# Patient Record
Sex: Female | Born: 1995 | Race: White | Hispanic: No | Marital: Single | Smoking: Current every day smoker
Health system: Southern US, Community
[De-identification: ages and names within clinical notes are randomized; demographics above are authoritative.]

## PROBLEM LIST (undated history)

## (undated) DIAGNOSIS — N390 Urinary tract infection, site not specified: Secondary | ICD-10-CM

## (undated) DIAGNOSIS — G8929 Other chronic pain: Secondary | ICD-10-CM

## (undated) DIAGNOSIS — F909 Attention-deficit hyperactivity disorder, unspecified type: Secondary | ICD-10-CM

## (undated) DIAGNOSIS — F419 Anxiety disorder, unspecified: Secondary | ICD-10-CM

## (undated) DIAGNOSIS — F32A Depression, unspecified: Secondary | ICD-10-CM

## (undated) DIAGNOSIS — R51 Headache: Secondary | ICD-10-CM

## (undated) HISTORY — DX: Urinary tract infection, site not specified: N39.0

## (undated) HISTORY — DX: Headache: R51

## (undated) HISTORY — DX: Other chronic pain: G89.29

## (undated) HISTORY — DX: Attention-deficit hyperactivity disorder, unspecified type: F90.9

## (undated) HISTORY — PX: NO PAST SURGERIES: SHX2092

---
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
## 2013-01-11 ENCOUNTER — Ambulatory Visit: Payer: Medicaid Other

## 2013-01-12 ENCOUNTER — Encounter (HOSPITAL_COMMUNITY): Payer: Self-pay | Admitting: *Deleted

## 2013-01-12 ENCOUNTER — Emergency Department (HOSPITAL_COMMUNITY)
Admission: EM | Admit: 2013-01-12 | Discharge: 2013-01-12 | Disposition: A | Discharge status: Home / Self Care | Payer: Medicaid Other | Attending: Emergency Medicine | Admitting: Emergency Medicine

## 2013-01-12 DIAGNOSIS — K047 Periapical abscess without sinus: Secondary | ICD-10-CM | POA: Insufficient documentation

## 2013-01-12 DIAGNOSIS — F17200 Nicotine dependence, unspecified, uncomplicated: Secondary | ICD-10-CM | POA: Insufficient documentation

## 2013-01-12 DIAGNOSIS — R220 Localized swelling, mass and lump, head: Secondary | ICD-10-CM | POA: Insufficient documentation

## 2013-01-12 DIAGNOSIS — Z79899 Other long term (current) drug therapy: Secondary | ICD-10-CM | POA: Insufficient documentation

## 2013-01-12 DIAGNOSIS — K029 Dental caries, unspecified: Secondary | ICD-10-CM | POA: Insufficient documentation

## 2013-01-12 DIAGNOSIS — R221 Localized swelling, mass and lump, neck: Secondary | ICD-10-CM | POA: Insufficient documentation

## 2013-01-12 DIAGNOSIS — R51 Headache: Secondary | ICD-10-CM | POA: Insufficient documentation

## 2013-01-12 MED ORDER — MORPHINE SULFATE 4 MG/ML IJ SOLN
4.0000 mg | Freq: Once | INTRAMUSCULAR | Status: AC
Start: 2013-01-12 — End: 2013-01-12
  Administered 2013-01-12: 4 mg via INTRAVENOUS
  Filled 2013-01-12: qty 1

## 2013-01-12 MED ORDER — SODIUM CHLORIDE 0.9 % IV SOLN
Freq: Once | INTRAVENOUS | Status: AC
  Administered 2013-01-12: 14:00:00 via INTRAVENOUS

## 2013-01-12 MED ORDER — CLINDAMYCIN HCL 300 MG PO CAPS: 300.0000 mg | ORAL_CAPSULE | Freq: Four times a day (QID) | ORAL | Status: DC

## 2013-01-12 MED ORDER — HYDROCODONE-ACETAMINOPHEN 5-325 MG PO TABS: ORAL_TABLET | ORAL | Status: DC

## 2013-01-12 MED ORDER — DEXTROSE 5 % IV SOLN: 900.0000 mg | Freq: Once | INTRAVENOUS | Status: DC

## 2013-01-12 MED ORDER — CLINDAMYCIN PHOSPHATE 900 MG/50ML IV SOLN
INTRAVENOUS | Status: AC
  Administered 2013-01-12: 900 mg via INTRAVENOUS
  Filled 2013-01-12: qty 50

## 2013-01-12 MED ORDER — CLINDAMYCIN PHOSPHATE 900 MG/50ML IV SOLN
900.0000 mg | Freq: Once | INTRAVENOUS | Status: AC
  Administered 2013-01-12: 900 mg via INTRAVENOUS

## 2013-01-12 NOTE — ED Notes (Signed)
Rt side of face swollen , red  Started penicillin on Monday for dental  Problem.  Says she is no better and advised by dentist to come here.
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
## 2013-01-12 NOTE — ED Notes (Signed)
Lt side of face swollen , dental pain.

## 2013-01-16 NOTE — ED Provider Notes (Signed)
Medical screening examination/treatment/procedure(s) were performed by non-physician practitioner and as supervising physician I was immediately available for consultation/collaboration.  Vashawn Ekstein B. Daronte Shostak, MD 01/16/13 1655 

## 2013-01-17 ENCOUNTER — Ambulatory Visit: Payer: Medicaid Other

## 2013-01-18 ENCOUNTER — Encounter: Payer: Self-pay | Admitting: Adult Health

## 2013-01-18 ENCOUNTER — Ambulatory Visit (INDEPENDENT_AMBULATORY_CARE_PROVIDER_SITE_OTHER): Payer: Medicaid Other | Admitting: Adult Health

## 2013-01-18 VITALS — BP 110/64 | Ht 60.0 in | Wt 124.0 lb

## 2013-01-18 DIAGNOSIS — Z3200 Encounter for pregnancy test, result unknown: Secondary | ICD-10-CM

## 2013-01-18 DIAGNOSIS — Z3049 Encounter for surveillance of other contraceptives: Secondary | ICD-10-CM

## 2013-01-18 DIAGNOSIS — Z309 Encounter for contraceptive management, unspecified: Secondary | ICD-10-CM

## 2013-01-18 DIAGNOSIS — Z3202 Encounter for pregnancy test, result negative: Secondary | ICD-10-CM

## 2013-01-18 MED ORDER — MEDROXYPROGESTERONE ACETATE 150 MG/ML IM SUSP
150.0000 mg | Freq: Once | INTRAMUSCULAR | Status: AC
  Administered 2013-01-18: 150 mg via INTRAMUSCULAR

## 2013-04-12 ENCOUNTER — Encounter: Payer: Self-pay | Admitting: Adult Health

## 2013-04-12 ENCOUNTER — Ambulatory Visit (INDEPENDENT_AMBULATORY_CARE_PROVIDER_SITE_OTHER): Payer: Medicaid Other | Admitting: Adult Health

## 2013-04-12 VITALS — BP 120/76 | Ht 60.0 in | Wt 127.0 lb

## 2013-04-12 DIAGNOSIS — Z309 Encounter for contraceptive management, unspecified: Secondary | ICD-10-CM

## 2013-04-12 DIAGNOSIS — Z3202 Encounter for pregnancy test, result negative: Secondary | ICD-10-CM

## 2013-04-12 DIAGNOSIS — Z3200 Encounter for pregnancy test, result unknown: Secondary | ICD-10-CM

## 2013-04-12 DIAGNOSIS — Z3049 Encounter for surveillance of other contraceptives: Secondary | ICD-10-CM

## 2013-04-12 LAB — POCT URINE PREGNANCY: Preg Test, Ur: NEGATIVE

## 2013-04-12 MED ORDER — MEDROXYPROGESTERONE ACETATE 150 MG/ML IM SUSP
150.0000 mg | Freq: Once | INTRAMUSCULAR | Status: AC
  Administered 2013-04-12: 150 mg via INTRAMUSCULAR

## 2013-06-27 ENCOUNTER — Other Ambulatory Visit: Payer: Self-pay | Admitting: Adult Health

## 2013-07-05 ENCOUNTER — Ambulatory Visit (INDEPENDENT_AMBULATORY_CARE_PROVIDER_SITE_OTHER): Payer: Medicaid Other | Admitting: Adult Health

## 2013-07-05 ENCOUNTER — Encounter: Payer: Self-pay | Admitting: Adult Health

## 2013-07-05 VITALS — BP 118/66 | Ht 60.0 in

## 2013-07-05 DIAGNOSIS — Z3200 Encounter for pregnancy test, result unknown: Secondary | ICD-10-CM

## 2013-07-05 DIAGNOSIS — Z309 Encounter for contraceptive management, unspecified: Secondary | ICD-10-CM

## 2013-07-05 DIAGNOSIS — Z3202 Encounter for pregnancy test, result negative: Secondary | ICD-10-CM

## 2013-07-05 DIAGNOSIS — Z3049 Encounter for surveillance of other contraceptives: Secondary | ICD-10-CM

## 2013-07-05 LAB — POCT URINE PREGNANCY: Preg Test, Ur: NEGATIVE

## 2013-07-05 MED ORDER — MEDROXYPROGESTERONE ACETATE 150 MG/ML IM SUSP
150.0000 mg | Freq: Once | INTRAMUSCULAR | Status: AC
  Administered 2013-07-05: 150 mg via INTRAMUSCULAR

## 2013-07-06 ENCOUNTER — Emergency Department (HOSPITAL_COMMUNITY)
Admission: EM | Admit: 2013-07-06 | Discharge: 2013-07-06 | Disposition: A | Discharge status: Home / Self Care | Payer: Medicaid Other | Attending: Emergency Medicine | Admitting: Emergency Medicine

## 2013-07-06 ENCOUNTER — Encounter (HOSPITAL_COMMUNITY): Payer: Self-pay | Admitting: Emergency Medicine

## 2013-07-06 DIAGNOSIS — J111 Influenza due to unidentified influenza virus with other respiratory manifestations: Secondary | ICD-10-CM | POA: Insufficient documentation

## 2013-07-06 DIAGNOSIS — F17200 Nicotine dependence, unspecified, uncomplicated: Secondary | ICD-10-CM | POA: Insufficient documentation

## 2013-07-06 DIAGNOSIS — R6889 Other general symptoms and signs: Secondary | ICD-10-CM

## 2013-07-06 MED ORDER — BENZONATATE 200 MG PO CAPS: 200.0000 mg | ORAL_CAPSULE | Freq: Three times a day (TID) | ORAL | Status: DC | PRN

## 2013-07-06 NOTE — Discharge Instructions (Signed)

## 2013-07-06 NOTE — ED Notes (Signed)
Pt reports being sick w/ flu like symptoms since Sunday.

## 2013-07-07 NOTE — ED Provider Notes (Signed)
CSN: [id number]     Arrival date & time 07/06/13  1416 History   First MD Initiated Contact with Patient 07/06/13 1620     Chief Complaint  Patient presents with  . Influenza   (Consider location/radiation/quality/duration/timing/severity/associated sxs/prior Treatment) HPI Comments: Carla Schaefer is a 18 y.o. Female with a 3 day history of uri type symptoms which includes nasal congestion with clear rhinorrhea, low grade fever and nonproductive cough.  Symptoms due to not include shortness of breath, chest pain,  Nausea, vomiting or diarrhea.  The patient has taken tylenol cold multi symptom formula prior to arrival with no significant improvement in symptoms.      The history is provided by the patient.    History reviewed. No pertinent past medical history. History reviewed. No pertinent past surgical history. Family History  Problem Relation Age of Onset  . Cancer Other   . Thyroid disease Other    History  Substance Use Topics  . Smoking status: Current Every Day Smoker -- 0.50 packs/day for 2 years    Types: Cigarettes  . Smokeless tobacco: Never Used  . Alcohol Use: No   OB History   Grav Para Term Preterm Abortions TAB SAB Ect Mult Living   1    1  1         Review of Systems  Constitutional: Positive for fever and chills.  HENT: Positive for congestion, rhinorrhea and sore throat. Negative for ear pain, sinus pressure, trouble swallowing and voice change.   Eyes: Negative for discharge.  Respiratory: Positive for cough. Negative for shortness of breath, wheezing and stridor.   Cardiovascular: Negative for chest pain.  Gastrointestinal: Negative for nausea, vomiting, abdominal pain and diarrhea.  Genitourinary: Negative.     Allergies  Compazine and Zofran  Home Medications   Current Outpatient Rx  Name  Route  Sig  Dispense  Refill  . medroxyPROGESTERone (DEPO-PROVERA) 150 MG/ML injection      TAKE TO MD OFFICE FOR INJECTION EVERY 3 MONTHS AS  INSTRUCTED   1 mL   3   . Phenyleph-Doxylamine-DM-APAP (TYLENOL COLD MULTI-SYMPTOM) 5-6.25-10-325 MG/15ML LIQD   Oral   Take 30 mLs by mouth 2 (two) times daily as needed.         . benzonatate (TESSALON) 200 MG capsule   Oral   Take 1 capsule (200 mg total) by mouth 3 (three) times daily as needed for cough.   30 capsule   0    BP 121/58  Pulse 96  Temp(Src) 98 F (36.7 C) (Oral)  Resp 20  Ht 5' (1.524 m)  Wt 127 lb (57.607 kg)  BMI 24.80 kg/m2  SpO2 100% Physical Exam  Constitutional: She is oriented to person, place, and time. She appears well-developed and well-nourished.  HENT:  Head: Normocephalic and atraumatic.  Right Ear: Tympanic membrane and ear canal normal.  Left Ear: Tympanic membrane and ear canal normal.  Nose: Mucosal edema and rhinorrhea present.  Mouth/Throat: Uvula is midline, oropharynx is clear and moist and mucous membranes are normal. No oropharyngeal exudate, posterior oropharyngeal edema, posterior oropharyngeal erythema or tonsillar abscesses.  Eyes: Conjunctivae are normal.  Cardiovascular: Normal rate and normal heart sounds.   Pulmonary/Chest: Effort normal and breath sounds normal. No respiratory distress. She has no decreased breath sounds. She has no wheezes. She has no rhonchi. She has no rales.  Frequent dry cough.  Lungs clear  Abdominal: Soft. There is no tenderness.  Musculoskeletal: Normal range of motion.  Neurological: She  is alert and oriented to person, place, and time.  Skin: Skin is warm and dry. No rash noted.  Psychiatric: She has a normal mood and affect.    ED Course  Procedures (including critical care time) Labs Review Labs Reviewed - No data to display Imaging Review No results found.  EKG Interpretation   None       MDM   1. Flu-like symptoms    URI.  Pt encouraged rest, increased fluids, motrin if needed for throat pain, may continue with tylenol cold medicine,  Prescribed tessalon for cough relief.     The patient appears reasonably screened and/or stabilized for discharge and I doubt any other medical condition or other [facility] requiring further screening, evaluation, or treatment in the ED at this time prior to discharge.     Burgess Amor, PA-C 07/07/13 1425

## 2013-07-08 NOTE — ED Provider Notes (Signed)
Medical screening examination/treatment/procedure(s) were performed by non-physician practitioner and as supervising physician I was immediately available for consultation/collaboration.  EKG Interpretation   None       Deneise Getty, MD, FACEP   Pressley Tadesse L Byron Peacock, MD 07/08/13 0811 

## 2013-09-27 ENCOUNTER — Ambulatory Visit: Payer: Self-pay

## 2013-09-28 ENCOUNTER — Encounter: Payer: Self-pay | Admitting: Obstetrics & Gynecology

## 2013-09-28 ENCOUNTER — Ambulatory Visit (INDEPENDENT_AMBULATORY_CARE_PROVIDER_SITE_OTHER): Payer: Self-pay | Admitting: Obstetrics & Gynecology

## 2013-09-28 ENCOUNTER — Encounter (INDEPENDENT_AMBULATORY_CARE_PROVIDER_SITE_OTHER): Payer: Self-pay

## 2013-09-28 VITALS — BP 120/62 | Ht 60.0 in | Wt 139.5 lb

## 2013-09-28 DIAGNOSIS — Z3049 Encounter for surveillance of other contraceptives: Secondary | ICD-10-CM

## 2013-09-28 DIAGNOSIS — Z309 Encounter for contraceptive management, unspecified: Secondary | ICD-10-CM

## 2013-09-28 DIAGNOSIS — Z3202 Encounter for pregnancy test, result negative: Secondary | ICD-10-CM

## 2013-09-28 LAB — POCT URINE PREGNANCY: Preg Test, Ur: NEGATIVE

## 2013-09-28 MED ORDER — MEDROXYPROGESTERONE ACETATE 150 MG/ML IM SUSP
150.0000 mg | Freq: Once | INTRAMUSCULAR | Status: AC
Start: 2013-09-28 — End: 2013-09-28
  Administered 2013-09-28: 150 mg via INTRAMUSCULAR

## 2013-09-28 NOTE — Progress Notes (Signed)
Pt here for Depo shot. To return in 12 weeks for next shot. JSY 

## 2013-10-20 ENCOUNTER — Emergency Department (HOSPITAL_COMMUNITY)
Admission: EM | Admit: 2013-10-20 | Discharge: 2013-10-20 | Disposition: A | Discharge status: Home / Self Care | Payer: Medicaid Other | Attending: Emergency Medicine | Admitting: Emergency Medicine

## 2013-10-20 ENCOUNTER — Encounter (HOSPITAL_COMMUNITY): Payer: Self-pay | Admitting: Emergency Medicine

## 2013-10-20 DIAGNOSIS — R109 Unspecified abdominal pain: Secondary | ICD-10-CM | POA: Insufficient documentation

## 2013-10-20 DIAGNOSIS — Z79899 Other long term (current) drug therapy: Secondary | ICD-10-CM | POA: Insufficient documentation

## 2013-10-20 DIAGNOSIS — F17200 Nicotine dependence, unspecified, uncomplicated: Secondary | ICD-10-CM | POA: Insufficient documentation

## 2013-10-20 DIAGNOSIS — N390 Urinary tract infection, site not specified: Secondary | ICD-10-CM | POA: Insufficient documentation

## 2013-10-20 DIAGNOSIS — Z3202 Encounter for pregnancy test, result negative: Secondary | ICD-10-CM | POA: Insufficient documentation

## 2013-10-20 LAB — URINE MICROSCOPIC-ADD ON

## 2013-10-20 LAB — URINALYSIS, ROUTINE W REFLEX MICROSCOPIC
Bilirubin Urine: NEGATIVE
GLUCOSE, UA: NEGATIVE mg/dL
KETONES UR: NEGATIVE mg/dL
Nitrite: POSITIVE — AB
Protein, ur: NEGATIVE mg/dL
Specific Gravity, Urine: 1.03 (ref 1.005–1.030)
UROBILINOGEN UA: 0.2 mg/dL (ref 0.0–1.0)
pH: 5.5 (ref 5.0–8.0)

## 2013-10-20 LAB — POC URINE PREG, ED: PREG TEST UR: NEGATIVE

## 2013-10-20 MED ORDER — SULFAMETHOXAZOLE-TRIMETHOPRIM 800-160 MG PO TABS: 1.0000 | ORAL_TABLET | Freq: Two times a day (BID) | ORAL | Status: AC

## 2013-10-20 MED ORDER — PHENAZOPYRIDINE HCL 200 MG PO TABS: 200.0000 mg | ORAL_TABLET | Freq: Three times a day (TID) | ORAL | Status: DC

## 2013-10-20 NOTE — ED Provider Notes (Signed)
CSN: [id number]     Arrival date & time 10/20/13  [id number] History   First MD Initiated Contact with Patient 10/20/13 [phone]     Chief Complaint  Patient presents with  . Urinary Tract Infection     (Consider location/radiation/quality/duration/timing/severity/associated sxs/prior Treatment) Patient is a 18 y.o. female presenting with urinary tract infection. The history is provided by the patient.  Urinary Tract Infection This is a new problem. The current episode started yesterday. The problem occurs constantly. The problem has been gradually worsening. Associated symptoms include abdominal pain, nausea and urinary symptoms. Pertinent negatives include no anorexia, chest pain, chills, coughing, fever, headaches, rash, sore throat, swollen glands or vomiting. Nothing aggravates the symptoms. Treatments tried: Azo. The treatment provided mild relief.   Newton PiggSamantha M Strand is a 18 y.o. G1 P0 who presents to the ED with dysuria. The symptoms started yesterday. She has been taking OTC medication without relief. Menses irregular due to Depo Provera. Usually has no bleeding but has has had spotting x 3 days and vaginal discharge. Last pap, never. Sexually active, current sex partner x 4 months, unprotected sex. No history of STI's.   History reviewed. No pertinent past medical history. History reviewed. No pertinent past surgical history. Family History  Problem Relation Age of Onset  . Cancer Other   . Thyroid disease Other    History  Substance Use Topics  . Smoking status: Current Every Day Smoker -- 0.50 packs/day for 2 years    Types: Cigarettes  . Smokeless tobacco: Never Used  . Alcohol Use: No   OB History   Grav Para Term Preterm Abortions TAB SAB Ect Mult Living   1    1  1         Review of Systems  Constitutional: Negative for fever and chills.  HENT: Negative.  Negative for sore throat.   Eyes: Negative for visual disturbance.  Respiratory: Negative for cough and shortness of  breath.   Cardiovascular: Negative for chest pain.  Gastrointestinal: Positive for nausea and abdominal pain. Negative for vomiting and anorexia.  Genitourinary: Positive for dysuria, vaginal bleeding and vaginal discharge. Negative for urgency and frequency.  Musculoskeletal: Negative for back pain.  Skin: Negative for rash.  Neurological: Negative for syncope and headaches.  Psychiatric/Behavioral: Negative for confusion. The patient is not nervous/anxious.       Allergies  Compazine and Zofran  Home Medications   Prior to Admission medications   Medication Sig Start Date End Date Taking? Authorizing Provider  AZO-CRANBERRY PO Take 2 tablets by mouth daily as needed (UTI symptoms).   Yes Historical Provider, MD  medroxyPROGESTERone (DEPO-PROVERA) 150 MG/ML injection TAKE TO MD OFFICE FOR INJECTION EVERY 3 MONTHS AS INSTRUCTED 06/27/13  Yes Adline PotterJennifer A Griffin, NP   BP 122/67  Pulse 104  Temp(Src) 98.2 F (36.8 C)  Resp 16  Ht 5' (1.524 m)  Wt 138 lb (62.596 kg)  BMI 26.95 kg/m2  SpO2 95% Physical Exam  Nursing note and vitals reviewed. Constitutional: She is oriented to person, place, and time. She appears well-developed and well-nourished. No distress.  HENT:  Head: Normocephalic.  Eyes: EOM are normal.  Neck: Neck supple.  Cardiovascular: Normal rate and regular rhythm.   Pulmonary/Chest: Effort normal and breath sounds normal.  Abdominal: Soft. There is tenderness in the suprapubic area.  Genitourinary:  Patient declined exam  Musculoskeletal: Normal range of motion.  Neurological: She is alert and oriented to person, place, and time. No cranial nerve deficit.  Skin:  Skin is warm and dry.  Psychiatric: She has a normal mood and affect. Her behavior is normal.   Results for orders placed during the hospital encounter of 10/20/13 (from the past 24 hour(s))  URINALYSIS, ROUTINE W REFLEX MICROSCOPIC     Status: Abnormal   Collection Time    10/20/13  9:14 AM       Result Value Ref Range   Color, Urine YELLOW  YELLOW   APPearance CLEAR  CLEAR   Specific Gravity, Urine 1.030  1.005 - 1.030   pH 5.5  5.0 - 8.0   Glucose, UA NEGATIVE  NEGATIVE mg/dL   Hgb urine dipstick LARGE (*) NEGATIVE   Bilirubin Urine NEGATIVE  NEGATIVE   Ketones, ur NEGATIVE  NEGATIVE mg/dL   Protein, ur NEGATIVE  NEGATIVE mg/dL   Urobilinogen, UA 0.2  0.0 - 1.0 mg/dL   Nitrite POSITIVE (*) NEGATIVE   Leukocytes, UA MODERATE (*) NEGATIVE  URINE MICROSCOPIC-ADD ON     Status: None   Collection Time    10/20/13  9:14 AM      Result Value Ref Range   Squamous Epithelial / LPF RARE  RARE   WBC, UA 7-10  <3 WBC/hpf   RBC / HPF 21-50  <3 RBC/hpf   Bacteria, UA RARE  RARE  POC URINE PREG, ED     Status: None   Collection Time    10/20/13  9:15 AM      Result Value Ref Range   Preg Test, Ur NEGATIVE  NEGATIVE    ED Course  Procedures  I discussed with the patient UTI symptoms, dysuria and STI symptoms such as Chlamydia. She does not want a pelvic exam today because she has no insurance and already has a bill from the ED. She states that she will go to the Health Department for STI screening. I discussed in detail complications of untreated STI's. She agrees to make appointment as soon as possible.  MDM  18 y.o. female with dysuria x 24 hours. Will treat with antibiotics and she will follow up with the health department for complete Women's Health exam. Stable for discharge without pelvic pain, fever or signs of PID. She will return here as needed for problems.    Medication List    TAKE these medications       phenazopyridine 200 MG tablet  Commonly known as:  PYRIDIUM  Take 1 tablet (200 mg total) by mouth 3 (three) times daily.     sulfamethoxazole-trimethoprim 800-160 MG per tablet  Commonly known as:  BACTRIM DS,SEPTRA DS  Take 1 tablet by mouth 2 (two) times daily.      ASK your doctor about these medications       AZO-CRANBERRY PO  Take 2 tablets by mouth  daily as needed (UTI symptoms).     medroxyPROGESTERone 150 MG/ML injection  Commonly known as:  DEPO-PROVERA  TAKE TO MD OFFICE FOR INJECTION EVERY 3 MONTHS AS INSTRUCTED            Janne NapoleonHope M Orvile Corona, NP 10/20/13 1702

## 2013-10-20 NOTE — Discharge Instructions (Signed)
It was nice to meet you today. Follow up with the health department for further screening. They can also take care of Women's Health routine care such as Pap Smears. Return here as needed for problems.

## 2013-10-20 NOTE — ED Notes (Signed)
Dysuria x 1 day, nausea today.

## 2013-10-20 NOTE — Care Management Note (Signed)
ED/CM noted patient did not have health insurance and/or PCP listed in the computer.  Patient was given the [place] resource handout with information on the clinics, food pantries, and the handout for new health insurance sign-up.  Patient expressed appreciation for information received. 

## 2013-10-22 NOTE — ED Provider Notes (Signed)
Medical screening examination/treatment/procedure(s) were performed by non-physician practitioner and as supervising physician I was immediately available for consultation/collaboration.   EKG Interpretation None       Anaid Haney, MD 10/22/13 0743 

## 2013-12-21 ENCOUNTER — Encounter: Payer: Self-pay | Admitting: Adult Health

## 2013-12-21 ENCOUNTER — Ambulatory Visit (INDEPENDENT_AMBULATORY_CARE_PROVIDER_SITE_OTHER): Payer: Self-pay | Admitting: Adult Health

## 2013-12-21 DIAGNOSIS — Z3202 Encounter for pregnancy test, result negative: Secondary | ICD-10-CM

## 2013-12-21 DIAGNOSIS — Z3042 Encounter for surveillance of injectable contraceptive: Secondary | ICD-10-CM

## 2013-12-21 DIAGNOSIS — Z3049 Encounter for surveillance of other contraceptives: Secondary | ICD-10-CM

## 2013-12-21 LAB — POCT URINE PREGNANCY: Preg Test, Ur: NEGATIVE

## 2013-12-21 MED ORDER — MEDROXYPROGESTERONE ACETATE 150 MG/ML IM SUSP
150.0000 mg | Freq: Once | INTRAMUSCULAR | Status: AC
  Administered 2013-12-21: 150 mg via INTRAMUSCULAR

## 2014-01-09 ENCOUNTER — Encounter (HOSPITAL_COMMUNITY): Payer: Self-pay | Admitting: Emergency Medicine

## 2014-01-09 ENCOUNTER — Emergency Department (HOSPITAL_COMMUNITY)
Admission: EM | Admit: 2014-01-09 | Discharge: 2014-01-09 | Disposition: A | Discharge status: Home / Self Care | Payer: Medicaid Other | Attending: Emergency Medicine | Admitting: Emergency Medicine

## 2014-01-09 DIAGNOSIS — N3090 Cystitis, unspecified without hematuria: Secondary | ICD-10-CM | POA: Insufficient documentation

## 2014-01-09 DIAGNOSIS — Z792 Long term (current) use of antibiotics: Secondary | ICD-10-CM | POA: Insufficient documentation

## 2014-01-09 DIAGNOSIS — Z79899 Other long term (current) drug therapy: Secondary | ICD-10-CM | POA: Insufficient documentation

## 2014-01-09 DIAGNOSIS — F17200 Nicotine dependence, unspecified, uncomplicated: Secondary | ICD-10-CM | POA: Insufficient documentation

## 2014-01-09 LAB — URINE MICROSCOPIC-ADD ON

## 2014-01-09 LAB — URINALYSIS, ROUTINE W REFLEX MICROSCOPIC
Bilirubin Urine: NEGATIVE
Glucose, UA: NEGATIVE mg/dL
HGB URINE DIPSTICK: NEGATIVE
Ketones, ur: NEGATIVE mg/dL
LEUKOCYTES UA: NEGATIVE
Nitrite: POSITIVE — AB
PH: 6 (ref 5.0–8.0)
Protein, ur: NEGATIVE mg/dL
SPECIFIC GRAVITY, URINE: 1.025 (ref 1.005–1.030)
Urobilinogen, UA: 0.2 mg/dL (ref 0.0–1.0)

## 2014-01-09 MED ORDER — CEPHALEXIN 500 MG PO CAPS: ORAL_CAPSULE | ORAL | Status: DC

## 2014-01-09 MED ORDER — PHENAZOPYRIDINE HCL 200 MG PO TABS: 200.0000 mg | ORAL_TABLET | Freq: Three times a day (TID) | ORAL | Status: DC | PRN

## 2014-01-09 NOTE — ED Notes (Signed)
C/o dysuria since Saturday. Reports that she took AZO this am.

## 2014-01-09 NOTE — Discharge Instructions (Signed)

## 2014-01-09 NOTE — ED Provider Notes (Signed)
CSN: [id number]     Arrival date & time 01/09/14  1439 History   This chart was scribed for Carla HornJohn M Jory Tanguma, MD by Carla Schaefer, ED Scribe. This patient was seen in room APFT22/APFT22 and the patient's care was started at 3:49 PM.   Chief Complaint  Patient presents with  . Dysuria    The history is provided by the patient. No language interpreter was used.    HPI Comments: Carla Schaefer is a 18 y.o. female with no chronic medical conditions who presents to the Emergency Department complaining of dysuria over the past 3 days. She reports associated lower back pain, urinary urgency, frequency and suprapubic abdominal pain pressure. She states that she took Azo this morning without relief of her symptoms. Pt states that she had a negative pregnancy test 2 weeks ago by her PCP. She states that she has a history of UTIs, and she suspects that she has a UTI currently. She denies any fever, emesis, vaginal bleeding or discharge. She states that she does not believe that she could be pregnant. She states that she is not allergic to Keflex.   History reviewed. No pertinent past medical history. History reviewed. No pertinent past surgical history. Family History  Problem Relation Age of Onset  . Cancer Other   . Thyroid disease Other    History  Substance Use Topics  . Smoking status: Current Every Day Smoker -- 0.50 packs/day for 2 years    Types: Cigarettes  . Smokeless tobacco: Never Used  . Alcohol Use: No   OB History   Grav Para Term Preterm Abortions TAB SAB Ect Mult Living   1    1  1         Review of Systems 10 Systems reviewed and all are negative for acute change except as noted in the HPI.  Allergies  Compazine and Zofran  Home Medications   Prior to Admission medications   Medication Sig Start Date End Date Taking? Authorizing Provider  AZO-CRANBERRY PO Take 2 tablets by mouth daily as needed (UTI symptoms).   Yes Historical Provider, MD  medroxyPROGESTERone  (DEPO-PROVERA) 150 MG/ML injection TAKE TO MD OFFICE FOR INJECTION EVERY 3 MONTHS AS INSTRUCTED 06/27/13  Yes Adline PotterJennifer A Griffin, NP  cephALEXin (KEFLEX) 500 MG capsule 2 caps po bid x 3 days 01/09/14   Carla HornJohn M Jaylah Goodlow, MD  phenazopyridine (PYRIDIUM) 200 MG tablet Take 1 tablet (200 mg total) by mouth 3 (three) times daily as needed for pain. 01/09/14   Carla HornJohn M Kadyn Guild, MD   Triage Vitals: BP 123/71  Pulse 94  Temp(Src) 97.9 F (36.6 C)  Resp 16  Wt 141 lb (63.957 kg)  SpO2 98%  Physical Exam  Nursing note and vitals reviewed. Constitutional:  Awake, alert, nontoxic appearance.  HENT:  Head: Atraumatic.  Eyes: Right eye exhibits no discharge. Left eye exhibits no discharge.  Neck: Neck supple.  Cardiovascular: Normal rate and regular rhythm.   No murmur heard. Pulmonary/Chest: Effort normal and breath sounds normal. No respiratory distress. She has no wheezes. She has no rales. She exhibits no tenderness.  Abdominal: Soft. Bowel sounds are normal. She exhibits no distension and no mass. There is no tenderness. There is no rebound and no guarding.  Genitourinary:  No CVAT  Musculoskeletal: She exhibits no tenderness.  Baseline ROM, no obvious new focal weakness.  Neurological:  Mental status and motor strength appears baseline for patient and situation.  Skin: No rash noted.  Psychiatric: She has  a normal mood and affect.    ED Course  Procedures (including critical care time)  DIAGNOSTIC STUDIES: Oxygen Saturation is 98% on RA, normal by my interpretation.    COORDINATION OF CARE: 3:49 PM- Patient / Family / Caregiver understand and agree with initial ED impression and plan with expectations set for ED visit.  Labs Review Labs Reviewed  URINALYSIS, ROUTINE W REFLEX MICROSCOPIC - Abnormal; Notable for the following:    APPearance HAZY (*)    Nitrite POSITIVE (*)    All other components within normal limits  URINE MICROSCOPIC-ADD ON - Abnormal; Notable for the following:     Squamous Epithelial / LPF FEW (*)    Bacteria, UA FEW (*)    All other components within normal limits    Imaging Review No results found.   EKG Interpretation None      MDM   Final diagnoses:  Cystitis    I personally performed the services described in this documentation, which was scribed in my presence. The recorded information has been reviewed and is accurate.   I doubt any other EMC precluding discharge at this time including, but not necessarily limited to the following:pyelonephritis, PID.  Carla Horn, MD 01/12/14 2055

## 2014-03-15 ENCOUNTER — Encounter: Payer: Self-pay | Admitting: Adult Health

## 2014-03-15 ENCOUNTER — Ambulatory Visit (INDEPENDENT_AMBULATORY_CARE_PROVIDER_SITE_OTHER): Payer: Self-pay | Admitting: Adult Health

## 2014-03-15 DIAGNOSIS — Z3049 Encounter for surveillance of other contraceptives: Secondary | ICD-10-CM

## 2014-03-15 DIAGNOSIS — Z3042 Encounter for surveillance of injectable contraceptive: Secondary | ICD-10-CM

## 2014-03-15 DIAGNOSIS — Z3202 Encounter for pregnancy test, result negative: Secondary | ICD-10-CM

## 2014-03-15 LAB — POCT URINE PREGNANCY: PREG TEST UR: NEGATIVE

## 2014-03-15 MED ORDER — MEDROXYPROGESTERONE ACETATE 150 MG/ML IM SUSP
150.0000 mg | Freq: Once | INTRAMUSCULAR | Status: AC
  Administered 2014-03-15: 150 mg via INTRAMUSCULAR

## 2014-04-10 ENCOUNTER — Encounter (HOSPITAL_COMMUNITY): Payer: Self-pay | Admitting: Emergency Medicine

## 2014-04-10 ENCOUNTER — Emergency Department (HOSPITAL_COMMUNITY)
Admission: EM | Admit: 2014-04-10 | Discharge: 2014-04-10 | Discharge status: Against Medical Advice | Payer: Medicaid Other | Attending: Emergency Medicine | Admitting: Emergency Medicine

## 2014-04-10 DIAGNOSIS — Z720 Tobacco use: Secondary | ICD-10-CM | POA: Insufficient documentation

## 2014-04-10 DIAGNOSIS — R109 Unspecified abdominal pain: Secondary | ICD-10-CM | POA: Insufficient documentation

## 2014-04-10 NOTE — ED Notes (Signed)
Called patient to place in room, no answer.

## 2014-04-10 NOTE — ED Notes (Signed)
Patient states "I was drinking a [place] Thursday and swallowed an air bubble and I have been hurting in my upper stomach since." States "I have been burping and it even hurts to touch it."

## 2014-04-26 ENCOUNTER — Emergency Department (HOSPITAL_COMMUNITY)
Admission: EM | Admit: 2014-04-26 | Discharge: 2014-04-26 | Disposition: A | Discharge status: Home / Self Care | Payer: Medicaid Other | Attending: Emergency Medicine | Admitting: Emergency Medicine

## 2014-04-26 ENCOUNTER — Encounter (HOSPITAL_COMMUNITY): Payer: Self-pay | Admitting: Emergency Medicine

## 2014-04-26 DIAGNOSIS — Z720 Tobacco use: Secondary | ICD-10-CM | POA: Insufficient documentation

## 2014-04-26 DIAGNOSIS — Z3202 Encounter for pregnancy test, result negative: Secondary | ICD-10-CM | POA: Insufficient documentation

## 2014-04-26 DIAGNOSIS — N390 Urinary tract infection, site not specified: Secondary | ICD-10-CM | POA: Insufficient documentation

## 2014-04-26 DIAGNOSIS — Z79899 Other long term (current) drug therapy: Secondary | ICD-10-CM | POA: Insufficient documentation

## 2014-04-26 LAB — URINALYSIS, ROUTINE W REFLEX MICROSCOPIC
Bilirubin Urine: NEGATIVE
Glucose, UA: NEGATIVE mg/dL
KETONES UR: NEGATIVE mg/dL
NITRITE: NEGATIVE
Specific Gravity, Urine: >1.03 — ABNORMAL HIGH (ref 1.005–1.030)
UROBILINOGEN UA: 0.2 mg/dL (ref 0.0–1.0)
pH: 6.5 (ref 5.0–8.0)

## 2014-04-26 LAB — BASIC METABOLIC PANEL
ANION GAP: 13 (ref 5–15)
BUN: 11 mg/dL (ref 6–23)
CALCIUM: 9.9 mg/dL (ref 8.4–10.5)
CHLORIDE: 101 meq/L (ref 96–112)
CO2: 27 meq/L (ref 19–32)
CREATININE: 0.81 mg/dL (ref 0.50–1.10)
GFR calc Af Amer: >90 mL/min (ref >90)
GFR calc non Af Amer: >90 mL/min (ref >90)
Glucose, Bld: 83 mg/dL (ref 70–99)
Potassium: 4 mEq/L (ref 3.7–5.3)
Sodium: 141 mEq/L (ref 137–147)

## 2014-04-26 LAB — CBC WITH DIFFERENTIAL/PLATELET
Basophils Absolute: 0.1 10*3/uL (ref 0.0–0.1)
Basophils Relative: 1 % (ref 0–1)
EOS ABS: 0.4 10*3/uL (ref 0.0–0.7)
EOS PCT: 4 % (ref 0–5)
HCT: 40.5 % (ref 36.0–46.0)
Hemoglobin: 14 g/dL (ref 12.0–15.0)
LYMPHS ABS: 3.1 10*3/uL (ref 0.7–4.0)
Lymphocytes Relative: 35 % (ref 12–46)
MCH: 30.3 pg (ref 26.0–34.0)
MCHC: 34.6 g/dL (ref 30.0–36.0)
MCV: 87.7 fL (ref 78.0–100.0)
MONO ABS: 0.5 10*3/uL (ref 0.1–1.0)
Monocytes Relative: 6 % (ref 3–12)
Neutro Abs: 4.8 10*3/uL (ref 1.7–7.7)
Neutrophils Relative %: 55 % (ref 43–77)
PLATELETS: 318 10*3/uL (ref 150–400)
RBC: 4.62 MIL/uL (ref 3.87–5.11)
RDW: 12.6 % (ref 11.5–15.5)
WBC: 8.9 10*3/uL (ref 4.0–10.5)

## 2014-04-26 LAB — URINE MICROSCOPIC-ADD ON

## 2014-04-26 LAB — PREGNANCY, URINE: Preg Test, Ur: NEGATIVE

## 2014-04-26 MED ORDER — CEPHALEXIN 500 MG PO CAPS
500.0000 mg | ORAL_CAPSULE | Freq: Once | ORAL | Status: AC
  Administered 2014-04-26: 500 mg via ORAL
  Filled 2014-04-26: qty 1

## 2014-04-26 MED ORDER — CEPHALEXIN 500 MG PO CAPS: 500.0000 mg | ORAL_CAPSULE | Freq: Four times a day (QID) | ORAL | Status: DC

## 2014-04-26 MED ORDER — IBUPROFEN 800 MG PO TABS: 800.0000 mg | ORAL_TABLET | Freq: Three times a day (TID) | ORAL | Status: DC | PRN

## 2014-04-26 NOTE — ED Notes (Signed)
Low abd pain , dysuria,  For 6 days.  Headache, low back pain

## 2014-04-26 NOTE — Discharge Instructions (Signed)
Drink plenty of fluids. And follow up with your md in 2 weeks

## 2014-04-26 NOTE — ED Provider Notes (Signed)
CSN: [id number]     Arrival date & time 04/26/14  1527 History  This chart was scribed for Benny LennertJoseph L Tera Pellicane, MD by Bronson CurbJacqueline Melvin, ED Scribe. This patient was seen in room APA02/APA02 and the patient's care was started at 4:54 PM.   Chief Complaint  Patient presents with  . Abdominal Pain    Patient is a 18 y.o. female presenting with abdominal pain. The history is provided by the patient. No language interpreter was used.  Abdominal Pain Pain location:  LUQ and LLQ Pain radiates to:  L flank and R flank Pain severity:  Moderate Duration:  6 days Timing:  Constant Progression:  Worsening Chronicity:  Recurrent Relieved by:  Nothing Ineffective treatments:  None tried Associated symptoms: dysuria   Associated symptoms: no chest pain, no cough, no diarrhea, no fatigue, no hematuria, no nausea and no vomiting     HPI Comments: Carla Schaefer is a 18 y.o. female who presents to the Emergency Department complaining of constant, lower abdominal pain for the past 6 days. She reports this pain radiates to her bilateral flanks. Patient has history of UTIs that are normall treated with juice and water. However, she reports this treatment has exacerbated her pain. She has not taken anything for symptoms relief. Patient is currently not taking ABX for her current UTI.   History reviewed. No pertinent past medical history. History reviewed. No pertinent past surgical history. Family History  Problem Relation Age of Onset  . Cancer Maternal Grandmother   . COPD Mother   . Emphysema Mother   . Thyroid disease Mother    History  Substance Use Topics  . Smoking status: Current Every Day Smoker -- 0.50 packs/day for 2 years    Types: Cigarettes  . Smokeless tobacco: Never Used  . Alcohol Use: No   OB History   Grav Para Term Preterm Abortions TAB SAB Ect Mult Living   1    1  1         Review of Systems  Constitutional: Negative for appetite change and fatigue.  HENT: Negative for  congestion, ear discharge and sinus pressure.   Eyes: Negative for discharge.  Respiratory: Negative for cough.   Cardiovascular: Negative for chest pain.  Gastrointestinal: Positive for abdominal pain. Negative for nausea, vomiting and diarrhea.  Genitourinary: Positive for dysuria and flank pain. Negative for frequency and hematuria.  Musculoskeletal: Negative for back pain.  Skin: Negative for rash.  Neurological: Negative for seizures and headaches.  Psychiatric/Behavioral: Negative for hallucinations.      Allergies  Compazine and Zofran  Home Medications   Prior to Admission medications   Medication Sig Start Date End Date Taking? Authorizing Provider  CRANBERRY PO Take 1 tablet by mouth daily.   Yes Historical Provider, MD  medroxyPROGESTERone (DEPO-PROVERA) 150 MG/ML injection Inject 150 mg into the muscle every 3 (three) months.   Yes Historical Provider, MD  ondansetron (ZOFRAN) 8 MG tablet Take 8 mg by mouth every 8 (eight) hours as needed for nausea or vomiting.   Yes Historical Provider, MD   Triage Vitals: BP 105/80  Pulse 97  Temp(Src) 98.8 F (37.1 C) (Oral)  Resp 18  Ht 5' (1.524 m)  Wt 134 lb (60.782 kg)  BMI 26.17 kg/m2  SpO2 97%  Physical Exam  Constitutional: She is oriented to person, place, and time. She appears well-developed.  HENT:  Head: Normocephalic.  Eyes: Conjunctivae and EOM are normal. No scleral icterus.  Neck: Neck supple. No thyromegaly  present.  Cardiovascular: Normal rate and regular rhythm.  Exam reveals no gallop and no friction rub.   No murmur heard. Pulmonary/Chest: No stridor. She has no wheezes. She has no rales. She exhibits no tenderness.  Abdominal: She exhibits no distension. There is tenderness in the suprapubic area. There is no rebound.  Mild suprapubic tenderness.  Musculoskeletal: Normal range of motion. She exhibits no edema.  Lymphadenopathy:    She has no cervical adenopathy.  Neurological: She is oriented to  person, place, and time. She exhibits normal muscle tone. Coordination normal.  Skin: No rash noted. No erythema.  Psychiatric: She has a normal mood and affect. Her behavior is normal.    ED Course  Procedures (including critical care time)  DIAGNOSTIC STUDIES: Oxygen Saturation is 97% on room air, adequate by my interpretation.    COORDINATION OF CARE: At [id number] Discussed treatment plan with patient which includes ABX. Patient agrees.   Labs Review Labs Reviewed  URINALYSIS, ROUTINE W REFLEX MICROSCOPIC - Abnormal; Notable for the following:    Specific Gravity, Urine >1.030 (*)    Hgb urine dipstick TRACE (*)    Protein, ur TRACE (*)    Leukocytes, UA SMALL (*)    All other components within normal limits  URINE MICROSCOPIC-ADD ON - Abnormal; Notable for the following:    Squamous Epithelial / LPF FEW (*)    Bacteria, UA MANY (*)    All other components within normal limits  PREGNANCY, URINE  CBC WITH DIFFERENTIAL  BASIC METABOLIC PANEL    Imaging Review No results found.   EKG Interpretation None      MDM   Final diagnoses:  None    Uti,  tx keflex   The chart was scribed for me under my direct supervision.  I personally performed the history, physical, and medical decision making and all procedures in the evaluation of this patient.Benny Lennert.   Remmi Armenteros L Octavis Sheeler, MD 04/26/14 [phone]

## 2014-04-26 NOTE — ED Notes (Signed)
Patient given discharge instruction, verbalized understand. Patient ambulatory out of the department.  

## 2014-05-01 ENCOUNTER — Encounter (HOSPITAL_COMMUNITY): Payer: Self-pay | Admitting: Emergency Medicine

## 2014-06-07 ENCOUNTER — Ambulatory Visit: Payer: Self-pay

## 2014-06-12 ENCOUNTER — Other Ambulatory Visit: Payer: Self-pay | Admitting: Adult Health

## 2014-06-12 ENCOUNTER — Encounter: Payer: Self-pay | Admitting: Adult Health

## 2014-06-12 ENCOUNTER — Telehealth: Payer: Self-pay | Admitting: *Deleted

## 2014-06-12 ENCOUNTER — Ambulatory Visit (INDEPENDENT_AMBULATORY_CARE_PROVIDER_SITE_OTHER): Payer: Self-pay | Admitting: Adult Health

## 2014-06-12 DIAGNOSIS — Z3042 Encounter for surveillance of injectable contraceptive: Secondary | ICD-10-CM

## 2014-06-12 DIAGNOSIS — Z3202 Encounter for pregnancy test, result negative: Secondary | ICD-10-CM

## 2014-06-12 LAB — POCT URINE PREGNANCY: PREG TEST UR: NEGATIVE

## 2014-06-12 MED ORDER — MEDROXYPROGESTERONE ACETATE 150 MG/ML IM SUSP
150.0000 mg | Freq: Once | INTRAMUSCULAR | Status: AC
  Administered 2014-06-12: 150 mg via INTRAMUSCULAR

## 2014-06-12 NOTE — Telephone Encounter (Signed)
Pharmacist from [facility] called. Pt is due to get Depo today but has no refills on Rx. I gave a verbal, Depo Provera 150 mg #1 injection, inject every 3 months with 3 refills per JAG. JSY

## 2014-09-04 ENCOUNTER — Encounter: Payer: Self-pay | Admitting: *Deleted

## 2014-09-04 ENCOUNTER — Ambulatory Visit (INDEPENDENT_AMBULATORY_CARE_PROVIDER_SITE_OTHER): Payer: BLUE CROSS/BLUE SHIELD | Admitting: *Deleted

## 2014-09-04 DIAGNOSIS — Z3202 Encounter for pregnancy test, result negative: Secondary | ICD-10-CM | POA: Diagnosis not present

## 2014-09-04 DIAGNOSIS — Z3042 Encounter for surveillance of injectable contraceptive: Secondary | ICD-10-CM | POA: Diagnosis not present

## 2014-09-04 LAB — POCT URINE PREGNANCY: Preg Test, Ur: NEGATIVE

## 2014-09-04 MED ORDER — MEDROXYPROGESTERONE ACETATE 150 MG/ML IM SUSP
150.0000 mg | Freq: Once | INTRAMUSCULAR | Status: AC
  Administered 2014-09-04: 150 mg via INTRAMUSCULAR

## 2014-09-04 NOTE — Progress Notes (Signed)
Pt here for Depo. Pt reports having pain during and after intercourse. Has been going on for a while. Pt also reports burning with urination. Pt to return this week for these issues and return in 12 weeks for next shot. JSY

## 2014-09-07 ENCOUNTER — Ambulatory Visit (INDEPENDENT_AMBULATORY_CARE_PROVIDER_SITE_OTHER): Payer: BLUE CROSS/BLUE SHIELD | Admitting: Advanced Practice Midwife

## 2014-09-07 ENCOUNTER — Encounter: Payer: Self-pay | Admitting: Advanced Practice Midwife

## 2014-09-07 VITALS — BP 106/76 | HR 72 | Ht 60.0 in | Wt 126.0 lb

## 2014-09-07 DIAGNOSIS — N390 Urinary tract infection, site not specified: Secondary | ICD-10-CM

## 2014-09-07 DIAGNOSIS — N941 Dyspareunia: Secondary | ICD-10-CM | POA: Diagnosis not present

## 2014-09-07 DIAGNOSIS — Z331 Pregnant state, incidental: Secondary | ICD-10-CM

## 2014-09-07 DIAGNOSIS — R300 Dysuria: Secondary | ICD-10-CM

## 2014-09-07 DIAGNOSIS — Z1389 Encounter for screening for other disorder: Secondary | ICD-10-CM

## 2014-09-07 DIAGNOSIS — IMO0002 Reserved for concepts with insufficient information to code with codable children: Secondary | ICD-10-CM | POA: Insufficient documentation

## 2014-09-07 DIAGNOSIS — Z3042 Encounter for surveillance of injectable contraceptive: Secondary | ICD-10-CM | POA: Diagnosis not present

## 2014-09-07 HISTORY — DX: Urinary tract infection, site not specified: N39.0

## 2014-09-07 LAB — POCT URINALYSIS DIPSTICK
Blood, UA: NEGATIVE
Glucose, UA: NEGATIVE
KETONES UA: NEGATIVE
Leukocytes, UA: NEGATIVE
Nitrite, UA: POSITIVE
Protein, UA: NEGATIVE

## 2014-09-07 MED ORDER — SULFAMETHOXAZOLE-TRIMETHOPRIM 800-160 MG PO TABS: 1.0000 | ORAL_TABLET | Freq: Two times a day (BID) | ORAL | Status: DC

## 2014-09-07 NOTE — Progress Notes (Signed)
[facility] Visit  Patient name: Carla Schaefer MRN [id number]  Date of birth: 10/02/1995  CC & HPI:  Carla Schaefer is a 19 y.o. Caucasian female presenting today for dysuria and frank blood in urine. Has had 4 UTI's in past 12 months, treated in the ED with Keflex, no cultures.  Also c/o painful sex with initial penetration with new boyfriend. Has had sex with a previous partner without difficulty, but his penis was "much, much smaller.".  Feels as if her "vagina tightens up when he tries to enter."  Uses lubrication, has vaginal orgasms. Does not orgasm with oral sex.  No hx of past sexual assault/abuse of any kind.  After about 10 minutes, sex is not painful, but still feels as if "his penis is too large for my vagina.".  Has been on Depo for 5 years and wants to stay on it.   Pertinent History Reviewed:  Medical & Surgical Hx:   History reviewed. No pertinent past medical history. History reviewed. No pertinent past surgical history. Family History  Problem Relation Age of Onset  . Cancer Maternal Grandmother   . COPD Mother   . Emphysema Mother   . Thyroid disease Mother     Current outpatient prescriptions:  .  medroxyPROGESTERone (DEPO-PROVERA) 150 MG/ML injection, TAKE TO MD OFFICE FOR INJECTION EVERY 3 MONTHS AS INSTRUCTED, Disp: 1 mL, Rfl: 3 .  Pseudoeph-Doxylamine-DM-APAP (TYLENOL NIGHTTIME COLD & FLU PO), Take by mouth as needed., Disp: , Rfl:  .  sulfamethoxazole-trimethoprim (BACTRIM DS,SEPTRA DS) 800-160 MG per tablet, Take 1 tablet by mouth 2 (two) times daily., Disp: 10 tablet, Rfl: 0 Social History: Reviewed -  reports that she has been smoking Cigarettes.  She has a 1 pack-year smoking history. She has never used smokeless tobacco.  Review of Systems:   Constitutional: Negative for fever and chills Back:  No back pain Gastrointestinal: Negative for abdominal pain. No suprapubic pain.  Genitourinary: Negative for  vaginal irritation or itching. No  change in discharge.    Objective Findings:  Vitals: BP 106/76 mmHg  Pulse 72  Ht 5' (1.524 m)  Wt 126 lb (57.153 kg)  BMI 24.61 kg/m2  Physical Examination: General appearance - alert, well appearing, and in no distress Mental status - normal mood, behavior, speech, dress, motor activity, and thought processes Abdomen - soft, nontender, nondistended, no masses or organomegaly Pelvic - normal external genitalia, vulva, vagina, cervix, uterus and adnexa Back exam - no CVAT Musculoskeletal - no muscular tenderness noted Extremities - no pedal edema noted  Results for orders placed or performed in visit on 09/07/14 (from the past 24 hour(s))  POCT urinalysis dipstick   Collection Time: 09/07/14  2:03 PM  Result Value Ref Range   Color, UA yellow    Clarity, UA clear    Glucose, UA neg    Bilirubin, UA     Ketones, UA neg    Spec Grav, UA     Blood, UA neg    pH, UA     Protein, UA neg    Urobilinogen, UA     Nitrite, UA positive    Leukocytes, UA Negative      40 minutes total encounter time 50% or more of this visit was spent in counseling and coordination of care.  30 minutes of face to face time.      Assessment & Plan:  A:   UTI, ? 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 Perhaps try smaller phallic devices before penetration   Dexa scan ordered    CRESENZO-DISHMAN,Jax Kentner CNM 09/07/2014 2:33 PM

## 2014-09-09 LAB — URINE CULTURE

## 2014-11-28 ENCOUNTER — Encounter: Payer: Self-pay | Admitting: *Deleted

## 2014-11-28 ENCOUNTER — Ambulatory Visit (INDEPENDENT_AMBULATORY_CARE_PROVIDER_SITE_OTHER): Payer: Medicaid Other | Admitting: *Deleted

## 2014-11-28 DIAGNOSIS — Z3202 Encounter for pregnancy test, result negative: Secondary | ICD-10-CM

## 2014-11-28 DIAGNOSIS — Z3042 Encounter for surveillance of injectable contraceptive: Secondary | ICD-10-CM | POA: Diagnosis not present

## 2014-11-28 LAB — POCT URINE PREGNANCY: PREG TEST UR: NEGATIVE

## 2014-11-28 MED ORDER — MEDROXYPROGESTERONE ACETATE 150 MG/ML IM SUSP
150.0000 mg | Freq: Once | INTRAMUSCULAR | Status: AC
  Administered 2014-11-28: 150 mg via INTRAMUSCULAR

## 2014-11-28 NOTE — Progress Notes (Signed)
Pt here for Depo. Reports no problems at this time. Return in 12 weeks for next shot. JSY 

## 2015-01-12 ENCOUNTER — Telehealth: Payer: Self-pay | Admitting: Obstetrics & Gynecology

## 2015-01-12 NOTE — Telephone Encounter (Signed)
Pt states been getting her depo shot x 5 years, now having severe cramps and abdominal pain, stomach bloated "feels like someone punching her in her stomach." OTC meds not helping. Pt advised to take pregnancy test to rule out pregnancy, call transferred to front staff for the soonest available appt to be scheduled. Pt advised to go to ER if abdominal pain and cramps increase. Pt verbalized understanding.

## 2015-01-13 ENCOUNTER — Encounter (HOSPITAL_COMMUNITY): Payer: Self-pay | Admitting: Emergency Medicine

## 2015-01-13 ENCOUNTER — Emergency Department (HOSPITAL_COMMUNITY)
Admission: EM | Admit: 2015-01-13 | Discharge: 2015-01-13 | Disposition: A | Discharge status: Home / Self Care | Payer: Self-pay | Attending: Emergency Medicine | Admitting: Emergency Medicine

## 2015-01-13 DIAGNOSIS — R110 Nausea: Secondary | ICD-10-CM | POA: Insufficient documentation

## 2015-01-13 DIAGNOSIS — Z720 Tobacco use: Secondary | ICD-10-CM | POA: Insufficient documentation

## 2015-01-13 DIAGNOSIS — R42 Dizziness and giddiness: Secondary | ICD-10-CM | POA: Insufficient documentation

## 2015-01-13 DIAGNOSIS — R102 Pelvic and perineal pain: Secondary | ICD-10-CM | POA: Insufficient documentation

## 2015-01-13 DIAGNOSIS — Z3202 Encounter for pregnancy test, result negative: Secondary | ICD-10-CM | POA: Insufficient documentation

## 2015-01-13 DIAGNOSIS — Z87440 Personal history of urinary (tract) infections: Secondary | ICD-10-CM | POA: Insufficient documentation

## 2015-01-13 LAB — URINALYSIS, ROUTINE W REFLEX MICROSCOPIC
Bilirubin Urine: NEGATIVE
GLUCOSE, UA: NEGATIVE mg/dL
Hgb urine dipstick: NEGATIVE
Ketones, ur: NEGATIVE mg/dL
LEUKOCYTES UA: NEGATIVE
Nitrite: NEGATIVE
PH: 6 (ref 5.0–8.0)
Protein, ur: NEGATIVE mg/dL
SPECIFIC GRAVITY, URINE: 1.025 (ref 1.005–1.030)
Urobilinogen, UA: 0.2 mg/dL (ref 0.0–1.0)

## 2015-01-13 LAB — BASIC METABOLIC PANEL
Anion gap: 7 (ref 5–15)
BUN: 11 mg/dL (ref 6–20)
CO2: 26 mmol/L (ref 22–32)
Calcium: 8.8 mg/dL — ABNORMAL LOW (ref 8.9–10.3)
Chloride: 107 mmol/L (ref 101–111)
Creatinine, Ser: 0.63 mg/dL (ref 0.44–1.00)
GFR calc non Af Amer: >60 mL/min (ref >60)
Glucose, Bld: 104 mg/dL — ABNORMAL HIGH (ref 65–99)
POTASSIUM: 3.6 mmol/L (ref 3.5–5.1)
Sodium: 140 mmol/L (ref 135–145)

## 2015-01-13 LAB — CBC WITH DIFFERENTIAL/PLATELET
BASOS PCT: 1 % (ref 0–1)
Basophils Absolute: 0.1 10*3/uL (ref 0.0–0.1)
Eosinophils Absolute: 0.3 10*3/uL (ref 0.0–0.7)
Eosinophils Relative: 4 % (ref 0–5)
HCT: 41.5 % (ref 36.0–46.0)
Hemoglobin: 14.2 g/dL (ref 12.0–15.0)
Lymphocytes Relative: 28 % (ref 12–46)
Lymphs Abs: 2.1 10*3/uL (ref 0.7–4.0)
MCH: 30.4 pg (ref 26.0–34.0)
MCHC: 34.2 g/dL (ref 30.0–36.0)
MCV: 88.9 fL (ref 78.0–100.0)
MONO ABS: 0.5 10*3/uL (ref 0.1–1.0)
MONOS PCT: 7 % (ref 3–12)
NEUTROS PCT: 60 % (ref 43–77)
Neutro Abs: 4.5 10*3/uL (ref 1.7–7.7)
PLATELETS: 262 10*3/uL (ref 150–400)
RBC: 4.67 MIL/uL (ref 3.87–5.11)
RDW: 12.7 % (ref 11.5–15.5)
WBC: 7.4 10*3/uL (ref 4.0–10.5)

## 2015-01-13 LAB — WET PREP, GENITAL
CLUE CELLS WET PREP: NONE SEEN
Trich, Wet Prep: NONE SEEN
YEAST WET PREP: NONE SEEN

## 2015-01-13 LAB — POC URINE PREG, ED: Preg Test, Ur: NEGATIVE

## 2015-01-13 MED ORDER — MORPHINE SULFATE 4 MG/ML IJ SOLN
4.0000 mg | Freq: Once | INTRAMUSCULAR | Status: AC
Start: 2015-01-13 — End: 2015-01-13
  Administered 2015-01-13: 4 mg via INTRAVENOUS
  Filled 2015-01-13: qty 1

## 2015-01-13 MED ORDER — HYDROCODONE-ACETAMINOPHEN 5-325 MG PO TABS: 2.0000 | ORAL_TABLET | ORAL | Status: DC | PRN

## 2015-01-13 MED ORDER — ONDANSETRON HCL 4 MG/2ML IJ SOLN
4.0000 mg | Freq: Once | INTRAMUSCULAR | Status: AC
  Administered 2015-01-13: 4 mg via INTRAVENOUS
  Filled 2015-01-13: qty 2

## 2015-01-13 NOTE — ED Notes (Signed)
Pt c/o lower abd pain with nausea. Denies v/d. Denies urinary sx.

## 2015-01-13 NOTE — Discharge Instructions (Signed)
Pelvic Pain Female pelvic pain can be caused by many different things and start from a variety of places. Pelvic pain refers to pain that is located in the lower half of the abdomen and between your hips. The pain may occur over a short period of time (acute) or may be reoccurring (chronic). The cause of pelvic pain may be related to disorders affecting the female reproductive organs (gynecologic), but it may also be related to the bladder, kidney stones, an intestinal complication, or muscle or skeletal problems. Getting help right away for pelvic pain is important, especially if there has been severe, sharp, or a sudden onset of unusual pain. It is also important to get help right away because some types of pelvic pain can be life threatening.  CAUSES  Below are only some of the causes of pelvic pain. The causes of pelvic pain can be in one of several categories.   Gynecologic.  Pelvic inflammatory disease.  Sexually transmitted infection.  Ovarian cyst or a twisted ovarian ligament (ovarian torsion).  Uterine lining that grows outside the uterus (endometriosis).  Fibroids, cysts, or tumors.  Ovulation.  Pregnancy.  Pregnancy that occurs outside the uterus (ectopic pregnancy).  Miscarriage.  Labor.  Abruption of the placenta or ruptured uterus.  Infection.  Uterine infection (endometritis).  Bladder infection.  Diverticulitis.  Miscarriage related to a uterine infection (septic abortion).  Bladder.  Inflammation of the bladder (cystitis).  Kidney stone(s).  Gastrointestinal.  Constipation.  Diverticulitis.  Neurologic.  Trauma.  Feeling pelvic pain because of mental or emotional causes (psychosomatic).  Cancers of the bowel or pelvis. EVALUATION  Your caregiver will want to take a careful history of your concerns. This includes recent changes in your health, a careful gynecologic history of your periods (menses), and a sexual history. Obtaining your family  history and medical history is also important. Your caregiver may suggest a pelvic exam. A pelvic exam will help identify the location and severity of the pain. It also helps in the evaluation of which organ system may be involved. In order to identify the cause of the pelvic pain and be properly treated, your caregiver may order tests. These tests may include:   A pregnancy test.  Pelvic ultrasonography.  An X-ray exam of the abdomen.  A urinalysis or evaluation of vaginal discharge.  Blood tests. HOME CARE INSTRUCTIONS   Only take over-the-counter or prescription medicines for pain, discomfort, or fever as directed by your caregiver.   Rest as directed by your caregiver.   Eat a balanced diet.   Drink enough fluids to make your urine clear or pale yellow, or as directed.   Avoid sexual intercourse if it causes pain.   Apply warm or cold compresses to the lower abdomen depending on which one helps the pain.   Avoid stressful situations.   Keep a journal of your pelvic pain. Write down when it started, where the pain is located, and if there are things that seem to be associated with the pain, such as food or your menstrual cycle.  Follow up with your caregiver as directed.  SEEK MEDICAL CARE IF:  Your medicine does not help your pain.  You have abnormal vaginal discharge. SEEK IMMEDIATE MEDICAL CARE IF:   You have heavy bleeding from the vagina.   Your pelvic pain increases.   You feel light-headed or faint.   You have chills.   You have pain with urination or blood in your urine.   You have uncontrolled diarrhea   or vomiting.   You have a fever or persistent symptoms for more than 3 days.  You have a fever and your symptoms suddenly get worse.   You are being physically or sexually abused.  MAKE SURE YOU:  Understand these instructions.  Will watch your condition.  Will get help if you are not doing well or get worse. Document Released:  05/13/2004 Document Revised: 10/31/2013 Document Reviewed: 10/06/2011 Exit[employer] Patient Information 2015 [employer]. This information is not intended to replace advice given to you by your health care provider. Make sure you discuss any questions you have with your health care provider.  

## 2015-01-13 NOTE — ED Provider Notes (Signed)
CSN: [id number]     Arrival date & time 01/13/15  1109 History  This chart was scribed for Gilda Creasehristopher J Lon Klippel, MD by Elveria Risingimelie Horne, ED scribe.  This patient was seen in room APA09/APA09 and the patient's care was started at 11:27 AM.   Chief Complaint  Patient presents with  . Abdominal Pain    HPI HPI Comments: Carla Schaefer is a 19 y.o. female who presents to the Emergency Department complaining of lower abdominal pain onset two weeks ago, that has been progressively worsening over the last four days. Associated symptoms include back pain, nausea, lightheadedness, cold chills, and subjective fever. Patient reports contraception with Depo-Provera shot for five years now; she had not a period since starting. Patient denies abnormal urinary or GU symptoms, vomiting, or diarrhea.   Past Medical History  Diagnosis Date  . UTI (lower urinary tract infection) 09/07/2014    4 in past 12 months. No cultures.  Culture 09/07/2014    History reviewed. No pertinent past surgical history. Family History  Problem Relation Age of Onset  . Cancer Maternal Grandmother   . COPD Mother   . Emphysema Mother   . Thyroid disease Mother    History  Substance Use Topics  . Smoking status: Current Every Day Smoker -- 0.50 packs/day for 2 years    Types: Cigarettes  . Smokeless tobacco: Never Used  . Alcohol Use: No   OB History    Gravida Para Term Preterm AB TAB SAB Ectopic Multiple Living   1    1  1         Review of Systems  Constitutional: Negative for fever and chills.  Gastrointestinal: Positive for nausea and abdominal pain. Negative for vomiting and diarrhea.  Genitourinary: Negative for dysuria, urgency, frequency, vaginal bleeding and vaginal discharge.  Neurological: Positive for light-headedness.  All other systems reviewed and are negative.     Allergies  Compazine and Zofran  Home Medications   Prior to Admission medications   Medication Sig Start Date End Date Taking?  Authorizing Provider  medroxyPROGESTERone (DEPO-PROVERA) 150 MG/ML injection TAKE TO MD OFFICE FOR INJECTION EVERY 3 MONTHS AS INSTRUCTED 06/12/14   Adline PotterJennifer A Griffin, NP   Triage Vitals: BP 130/83 mmHg  Pulse 89  Temp(Src) 99.4 F (37.4 C)  Resp 18  Ht 5' (1.524 m)  Wt 136 lb (61.689 kg)  BMI 26.56 kg/m2  SpO2 100% Physical Exam  Constitutional: She is oriented to person, place, and time. She appears well-developed and well-nourished. No distress.  HENT:  Head: Normocephalic and atraumatic.  Right Ear: Hearing normal.  Left Ear: Hearing normal.  Nose: Nose normal.  Mouth/Throat: Oropharynx is clear and moist and mucous membranes are normal.  Eyes: Conjunctivae and EOM are normal. Pupils are equal, round, and reactive to light.  Neck: Normal range of motion. Neck supple.  Cardiovascular: Regular rhythm, S1 normal and S2 normal.  Exam reveals no gallop and no friction rub.   No murmur heard. Pulmonary/Chest: Effort normal and breath sounds normal. No respiratory distress. She exhibits no tenderness.  Abdominal: Soft. Normal appearance and bowel sounds are normal. There is no hepatosplenomegaly. There is tenderness. There is no rebound, no guarding, no tenderness at McBurney's point and negative Murphy's sign. No hernia. Hernia confirmed negative in the right inguinal area and confirmed negative in the left inguinal area.  Lower abdominal and pelvic tenderness, more on left than right.   Genitourinary: Vagina normal and uterus normal. There is no rash or  tenderness on the right labia. There is no rash or tenderness on the left labia. Cervix exhibits no motion tenderness and no discharge. Right adnexum displays tenderness. Right adnexum displays no mass. Left adnexum displays tenderness. Left adnexum displays no mass.  Musculoskeletal: Normal range of motion.  Neurological: She is alert and oriented to person, place, and time. She has normal strength. No cranial nerve deficit or sensory  deficit. Coordination normal. GCS eye subscore is 4. GCS verbal subscore is 5. GCS motor subscore is 6.  Skin: Skin is warm, dry and intact. No rash noted. No cyanosis.  Psychiatric: She has a normal mood and affect. Her speech is normal and behavior is normal. Thought content normal.  Nursing note and vitals reviewed.   ED Course  Procedures (including critical care time)  COORDINATION OF CARE: 11:33 AM- Discussed treatment plan with patient at bedside and patient agreed to plan.   Labs Review Labs Reviewed - No data to display  Imaging Review No results found.   EKG Interpretation None      MDM   Final diagnoses:  None   pelvic pain  Presents to the emergency room for evaluation of lower abdominal and pelvic pain. Patient reports symptoms have been ongoing for 2 weeks. She reports that for the last 4 days, however, she has been experiencing increasing pain. Patient's vital signs are normal. All of her labs are entirely normal. She does not have leukocytosis, WBC count is 7.4. Urinalysis was clear, no signs of infection.  Examination revealed diffuse lower abdominal and pelvic tenderness without guarding or rebound. No tenderness at McBurney's point. Pelvic examination revealed bilateral adnexal tenderness without mass. No cervical motion tenderness or discharge.  At this time, etiology appears to be pelvic in nature. She reports that she has follow-up with her OB/GYN doctor in 2 days. Patient will be treated with analgesia, follow up with OB/GYN. Return to the ER for worsening symptoms.  I personally performed the services described in this documentation, which was scribed in my presence. The recorded information has been reviewed and is accurate.    Gilda Crease, MD 01/13/15 1406

## 2015-01-14 LAB — HIV ANTIBODY (ROUTINE TESTING W REFLEX): HIV Screen 4th Generation wRfx: NONREACTIVE

## 2015-01-14 LAB — RPR: RPR Ser Ql: NONREACTIVE

## 2015-01-15 ENCOUNTER — Ambulatory Visit: Payer: BLUE CROSS/BLUE SHIELD | Admitting: Women's Health

## 2015-01-15 LAB — GC/CHLAMYDIA PROBE AMP (~~LOC~~) NOT AT ARMC
Chlamydia: NEGATIVE
NEISSERIA GONORRHEA: NEGATIVE

## 2015-02-20 ENCOUNTER — Ambulatory Visit: Payer: BLUE CROSS/BLUE SHIELD

## 2015-03-21 ENCOUNTER — Ambulatory Visit: Payer: Medicaid Other

## 2015-03-21 ENCOUNTER — Other Ambulatory Visit: Payer: Medicaid Other

## 2015-03-21 ENCOUNTER — Encounter: Payer: Self-pay | Admitting: Women's Health

## 2015-03-21 ENCOUNTER — Ambulatory Visit (INDEPENDENT_AMBULATORY_CARE_PROVIDER_SITE_OTHER): Payer: Medicaid Other | Admitting: Women's Health

## 2015-03-21 ENCOUNTER — Other Ambulatory Visit: Payer: Self-pay | Admitting: Adult Health

## 2015-03-21 VITALS — BP 110/60 | HR 88 | Ht 59.75 in | Wt 138.0 lb

## 2015-03-21 DIAGNOSIS — IMO0002 Reserved for concepts with insufficient information to code with codable children: Secondary | ICD-10-CM

## 2015-03-21 DIAGNOSIS — Z3042 Encounter for surveillance of injectable contraceptive: Secondary | ICD-10-CM

## 2015-03-21 DIAGNOSIS — Z309 Encounter for contraceptive management, unspecified: Secondary | ICD-10-CM

## 2015-03-21 DIAGNOSIS — R102 Pelvic and perineal pain: Secondary | ICD-10-CM | POA: Insufficient documentation

## 2015-03-21 DIAGNOSIS — Z113 Encounter for screening for infections with a predominantly sexual mode of transmission: Secondary | ICD-10-CM

## 2015-03-21 DIAGNOSIS — Z01419 Encounter for gynecological examination (general) (routine) without abnormal findings: Secondary | ICD-10-CM

## 2015-03-21 LAB — BETA HCG QUANT (REF LAB): hCG Quant: <1 m[IU]/mL

## 2015-03-21 NOTE — Progress Notes (Signed)
Patient ID: Carla Schaefer, female   DOB: [birth year], 19 y.o.   MRN: [id number] Subjective:   Carla Schaefer is a 19 y.o. G13P0010 Caucasian female originally here for depo injection, but has not had a [facility] physical, so has to have that first. No LMP recorded. Patient has had an injection.    Current complaints: pelvic pain- reports she was told at ED she has ovarian cysts- was unable to pay for pelvic u/s so has had no imaging. Pain w/ insertion and during entire sexual intercourse.  PCP: Dr. Nelwyn Salisbury       Smokes 11 cigarettes/day, trying to quit, is down from 1ppd  The following portions of the patient's history were reviewed and updated as appropriate: allergies, current medications, past family history, past medical history, past social history, past surgical history and problem list.  Past Medical History Past Medical History  Diagnosis Date  . UTI (lower urinary tract infection) 09/07/2014    4 in past 12 months. No cultures.  Culture 09/07/2014   . Chronic headaches     Past Surgical History History reviewed. No pertinent past surgical history.  Gynecologic History G1P0010  No LMP recorded. Patient has had an injection. Contraception: Depo-Provera injections Last Pap: n/a <21yo. Results were: n/a Last mammogram: never. Results were: n/a Last TCS: never  Obstetric History OB History  Gravida Para Term Preterm AB SAB TAB Ectopic Multiple Living  # Outcome Date GA Lbr Len/2nd Weight Sex Delivery Anes PTL Lv  1 SAB 2010              Current Medications Current Outpatient Prescriptions on File Prior to Visit  Medication Sig Dispense Refill  . medroxyPROGESTERone (DEPO-PROVERA) 150 MG/ML injection TAKE TO MD OFFICE FOR INJECTION EVERY 3 MONTHS AS INSTRUCTED 1 mL 3  . Aspirin-Acetaminophen-Caffeine (EXCEDRIN PO) Take 2 tablets by mouth every 6 (six) hours as needed (pain).    Marland Kitchen HYDROcodone-acetaminophen (NORCO/VICODIN) 5-325 MG per tablet Take 2  tablets by mouth every 4 (four) hours as needed for moderate pain. (Patient not taking: Reported on 03/21/2015) 20 tablet 0   No current facility-administered medications on file prior to visit.    Review of Systems Patient denies any headaches, blurred vision, shortness of breath, chest pain, abdominal pain, problems with bowel movements, urination, or intercourse.  Objective:  BP 110/60 mmHg  Pulse 88  Ht 4' 11.75" (1.518 m)  Wt 138 lb (62.596 kg)  BMI 27.16 kg/m2 Physical Exam  General:  Well developed, well nourished, no acute distress. She is alert and oriented x3. Skin:  Warm and dry Neck:  Midline trachea, no thyromegaly or nodules Cardiovascular: Regular rate and rhythm, no murmur heard Lungs:  Effort normal, all lung fields clear to auscultation bilaterally Breasts:  No dominant palpable mass, retraction, or nipple discharge Abdomen:  Soft, non tender, no hepatosplenomegaly or masses Pelvic:  External genitalia is normal in appearance.  The vagina is normal in appearance. The cervix is bulbous, no CMT.  Thin prep pap is not done. Some tightness at introitus on insertion of fingers for bimanual- pt was holding breath and tensing-states she does this during sex as well. Uterus is felt to be normal size, shape, and contour.  No adnexal masses, however bilateral adnexal tenderness. Extremities:  No swelling or varicosities noted Psych:  She has a normal mood and affect  Assessment:   Healthy well-woman exam Pelvic pain Pain  w/ insertion & sexual intercourse, possible vaginismus vs. Pt tensing/holding breath  Plan:  GC/CT from urine, HIV, RPR, HSV2 today Use lubricant, take deep breath, relax muscles when having sex- if not helping can try vaginal dilators to see if helps F/U 1d for depo injection Recommended pelvic u/s when she is able to  Mammogram  or sooner if problems Colonoscopy  or sooner if problems  Marge Duncans CNM, [facility] 03/21/2015 4:26  PM

## 2015-03-21 NOTE — Addendum Note (Signed)
Addended by: Cheral Marker on: 03/21/2015 04:40 PM   Modules accepted: Orders

## 2015-03-22 ENCOUNTER — Ambulatory Visit (INDEPENDENT_AMBULATORY_CARE_PROVIDER_SITE_OTHER): Payer: Self-pay | Admitting: *Deleted

## 2015-03-22 ENCOUNTER — Encounter: Payer: Self-pay | Admitting: *Deleted

## 2015-03-22 DIAGNOSIS — Z3042 Encounter for surveillance of injectable contraceptive: Secondary | ICD-10-CM

## 2015-03-22 LAB — HIV ANTIBODY (ROUTINE TESTING W REFLEX): HIV Screen 4th Generation wRfx: NONREACTIVE

## 2015-03-22 LAB — GC/CHLAMYDIA PROBE AMP
CHLAMYDIA, DNA PROBE: NEGATIVE
Neisseria gonorrhoeae by PCR: NEGATIVE

## 2015-03-22 LAB — HSV 2 ANTIBODY, IGG: HSV 2 Glycoprotein G Ab, IgG: <0.91 index (ref 0.00–0.90)

## 2015-03-22 LAB — RPR: RPR Ser Ql: NONREACTIVE

## 2015-03-22 MED ORDER — MEDROXYPROGESTERONE ACETATE 150 MG/ML IM SUSP
150.0000 mg | Freq: Once | INTRAMUSCULAR | Status: AC
  Administered 2015-03-22: 150 mg via INTRAMUSCULAR

## 2015-03-22 NOTE — Progress Notes (Signed)
Patient ID: Carla Schaefer, female   DOB: 1995/12/01, 19 y.o.   MRN: [id number] Pt here today for DEPO injection. Pt had QCHG done yesterday and did NOT have sex.

## 2015-06-14 ENCOUNTER — Other Ambulatory Visit: Payer: Self-pay | Admitting: Adult Health

## 2015-06-14 ENCOUNTER — Ambulatory Visit: Payer: Medicaid Other

## 2015-06-15 ENCOUNTER — Ambulatory Visit (INDEPENDENT_AMBULATORY_CARE_PROVIDER_SITE_OTHER): Payer: Medicaid Other | Admitting: *Deleted

## 2015-06-15 DIAGNOSIS — Z3042 Encounter for surveillance of injectable contraceptive: Secondary | ICD-10-CM | POA: Diagnosis not present

## 2015-06-15 DIAGNOSIS — Z3202 Encounter for pregnancy test, result negative: Secondary | ICD-10-CM

## 2015-06-15 LAB — POCT URINE PREGNANCY: Preg Test, Ur: NEGATIVE

## 2015-06-15 MED ORDER — MEDROXYPROGESTERONE ACETATE 150 MG/ML IM SUSP
150.0000 mg | Freq: Once | INTRAMUSCULAR | Status: AC
  Administered 2015-06-15: 150 mg via INTRAMUSCULAR

## 2015-06-15 NOTE — Progress Notes (Signed)
Patient ID: Carla PiggSamantha M Lory, female   DOB: 02/17/1996, 19 y.o.   MRN: [id number] Depo Provera 150 mg IM given left deltoid with no complications, pregnancy test negative. Pt to return in 12 weeks for next injection.

## 2015-09-07 ENCOUNTER — Ambulatory Visit: Payer: Medicaid Other

## 2015-10-17 ENCOUNTER — Ambulatory Visit (INDEPENDENT_AMBULATORY_CARE_PROVIDER_SITE_OTHER): Payer: Medicaid Other | Admitting: *Deleted

## 2015-10-17 ENCOUNTER — Encounter: Payer: Self-pay | Admitting: *Deleted

## 2015-10-17 ENCOUNTER — Encounter: Payer: Self-pay | Admitting: Advanced Practice Midwife

## 2015-10-17 ENCOUNTER — Ambulatory Visit (INDEPENDENT_AMBULATORY_CARE_PROVIDER_SITE_OTHER): Payer: Medicaid Other | Admitting: Advanced Practice Midwife

## 2015-10-17 VITALS — BP 110/60 | HR 78 | Ht 60.0 in | Wt 145.0 lb

## 2015-10-17 DIAGNOSIS — Z3202 Encounter for pregnancy test, result negative: Secondary | ICD-10-CM

## 2015-10-17 DIAGNOSIS — Z3009 Encounter for other general counseling and advice on contraception: Secondary | ICD-10-CM

## 2015-10-17 DIAGNOSIS — Z3049 Encounter for surveillance of other contraceptives: Secondary | ICD-10-CM

## 2015-10-17 DIAGNOSIS — Z3042 Encounter for surveillance of injectable contraceptive: Secondary | ICD-10-CM | POA: Diagnosis not present

## 2015-10-17 LAB — POCT URINE PREGNANCY: PREG TEST UR: NEGATIVE

## 2015-10-17 MED ORDER — MEDROXYPROGESTERONE ACETATE 150 MG/ML IM SUSP
150.0000 mg | Freq: Once | INTRAMUSCULAR | Status: AC
  Administered 2015-10-17: 150 mg via INTRAMUSCULAR

## 2015-10-17 MED ORDER — MEDROXYPROGESTERONE ACETATE 150 MG/ML IM SUSP: INTRAMUSCULAR | Status: DC

## 2015-10-17 NOTE — Progress Notes (Signed)
   [facility] Visit  Patient name: Carla Schaefer MRN [id number]  Date of birth: 09/26/1995  CC & HPI:  Carla Schaefer is a 20 y.o. Caucasian female presenting today for birth control discussion.  "I just want to know if anything else is out there."She has been on depo for 6 years, last shot 12/16.  Also has had some breast tenderness "all over both breasts" for 2-3 months. Has drastically decreased caffeine.   Pertinent History Reviewed:  Medical & Surgical Hx:   Past Medical History  Diagnosis Date  . UTI (lower urinary tract infection) 09/07/2014    4 in past 12 months. No cultures.  Culture 09/07/2014   . Chronic headaches    History reviewed. No pertinent past surgical history. Family History  Problem Relation Age of Onset  . Cancer Maternal Grandmother   . COPD Mother   . Emphysema Mother   . Thyroid disease Mother     Current outpatient prescriptions:  .  Aspirin-Acetaminophen-Caffeine (EXCEDRIN PO), Take 2 tablets by mouth every 6 (six) hours as needed (pain)., Disp: , Rfl:  .  medroxyPROGESTERone (DEPO-PROVERA) 150 MG/ML injection, TAKE TO THE DOCTOR FOR INJECTION EVERY 3 MONTHS, Disp: 1 mL, Rfl: 2 .  acetaminophen (TYLENOL) 500 MG tablet, Take 500 mg by mouth every 6 (six) hours as needed. Reported on 10/17/2015, Disp: , Rfl:  Social History: Reviewed -  reports that she has been smoking Cigarettes.  She has a 1 pack-year smoking history. She has never used smokeless tobacco.  Review of Systems:   Constitutional: Negative for fever and chills Eyes: Negative for visual disturbances Respiratory: Negative for shortness of breath, dyspnea Cardiovascular: Negative for chest pain or palpitations  Gastrointestinal: Negative for vomiting, diarrhea and constipation; no abdominal pain Genitourinary: Negative for dysuria and urgency, vaginal irritation or itching Musculoskeletal: Negative for back pain, joint pain, myalgias  Neurological: Negative for dizziness and  headaches    Objective Findings:    Physical Examination: General appearance - well appearing, and in no distress Mental status - alert, oriented to person, place, and time Chest:  Normal respiratory effort Heart - normal rate and regular rhythm Abdomen:  Soft, nontender Pelvic: deferred Musculoskeletal:  Normal range of motion without pain Extremities:  No edema    Results for orders placed or performed in visit on 10/17/15 (from the past 24 hour(s))  POCT urine pregnancy   Collection Time: 10/17/15 12:11 PM  Result Value Ref Range   Preg Test, Ur Negative Negative      Assessment & Plan:  A:   Contraception management P:  Restart Depo.  Rx sent to pharmacy  Condoms for 2 weeks.     Breast tenderness 2/2 hormones?  Wear good bra, sports bra at night   Return for will bring depo back.  CRESENZO-DISHMAN,Jamorris Ndiaye CNM 10/17/2015 12:19 PM

## 2015-10-17 NOTE — Progress Notes (Signed)
Pt here for Depo. Pt had negative pregnancy test at earlier visit. Pt tolerated shot well. Return in 12 weeks for next shot. JSY

## 2015-11-19 ENCOUNTER — Emergency Department (HOSPITAL_COMMUNITY)
Admission: EM | Admit: 2015-11-19 | Discharge: 2015-11-19 | Disposition: A | Discharge status: Home / Self Care | Payer: Self-pay | Attending: Emergency Medicine | Admitting: Emergency Medicine

## 2015-11-19 ENCOUNTER — Encounter (HOSPITAL_COMMUNITY): Payer: Self-pay | Admitting: Emergency Medicine

## 2015-11-19 DIAGNOSIS — Z7982 Long term (current) use of aspirin: Secondary | ICD-10-CM | POA: Insufficient documentation

## 2015-11-19 DIAGNOSIS — R102 Pelvic and perineal pain: Secondary | ICD-10-CM | POA: Insufficient documentation

## 2015-11-19 DIAGNOSIS — Z79899 Other long term (current) drug therapy: Secondary | ICD-10-CM | POA: Insufficient documentation

## 2015-11-19 DIAGNOSIS — F17210 Nicotine dependence, cigarettes, uncomplicated: Secondary | ICD-10-CM | POA: Insufficient documentation

## 2015-11-19 LAB — BASIC METABOLIC PANEL
ANION GAP: 6 (ref 5–15)
BUN: 8 mg/dL (ref 6–20)
CHLORIDE: 106 mmol/L (ref 101–111)
CO2: 25 mmol/L (ref 22–32)
Calcium: 9.1 mg/dL (ref 8.9–10.3)
Creatinine, Ser: 0.59 mg/dL (ref 0.44–1.00)
GFR calc Af Amer: >60 mL/min (ref >60)
GLUCOSE: 105 mg/dL — AB (ref 65–99)
POTASSIUM: 3.2 mmol/L — AB (ref 3.5–5.1)
Sodium: 137 mmol/L (ref 135–145)

## 2015-11-19 LAB — URINALYSIS, ROUTINE W REFLEX MICROSCOPIC
BILIRUBIN URINE: NEGATIVE
Glucose, UA: NEGATIVE mg/dL
Hgb urine dipstick: NEGATIVE
Ketones, ur: NEGATIVE mg/dL
Leukocytes, UA: NEGATIVE
Nitrite: NEGATIVE
Protein, ur: NEGATIVE mg/dL
SPECIFIC GRAVITY, URINE: 1.015 (ref 1.005–1.030)
pH: 6 (ref 5.0–8.0)

## 2015-11-19 LAB — WET PREP, GENITAL
Clue Cells Wet Prep HPF POC: NONE SEEN
SPERM: NONE SEEN
TRICH WET PREP: NONE SEEN
YEAST WET PREP: NONE SEEN

## 2015-11-19 LAB — CBC WITH DIFFERENTIAL/PLATELET
BASOS ABS: 0.1 10*3/uL (ref 0.0–0.1)
Basophils Relative: 1 %
Eosinophils Absolute: 0.6 10*3/uL (ref 0.0–0.7)
Eosinophils Relative: 5 %
HCT: 43.2 % (ref 36.0–46.0)
HEMOGLOBIN: 14.7 g/dL (ref 12.0–15.0)
LYMPHS ABS: 4.3 10*3/uL — AB (ref 0.7–4.0)
LYMPHS PCT: 38 %
MCH: 30.3 pg (ref 26.0–34.0)
MCHC: 34 g/dL (ref 30.0–36.0)
MCV: 89.1 fL (ref 78.0–100.0)
Monocytes Absolute: 0.4 10*3/uL (ref 0.1–1.0)
Monocytes Relative: 4 %
NEUTROS ABS: 6 10*3/uL (ref 1.7–7.7)
NEUTROS PCT: 52 %
PLATELETS: 278 10*3/uL (ref 150–400)
RBC: 4.85 MIL/uL (ref 3.87–5.11)
RDW: 12.4 % (ref 11.5–15.5)
WBC: 11.4 10*3/uL — AB (ref 4.0–10.5)

## 2015-11-19 LAB — PREGNANCY, URINE: Preg Test, Ur: NEGATIVE

## 2015-11-19 MED ORDER — ONDANSETRON 4 MG PO TBDP
4.0000 mg | ORAL_TABLET | Freq: Once | ORAL | Status: AC
  Administered 2015-11-19: 4 mg via ORAL
  Filled 2015-11-19: qty 1

## 2015-11-19 MED ORDER — LIDOCAINE HCL (PF) 1 % IJ SOLN
INTRAMUSCULAR | Status: AC
  Administered 2015-11-19: 5 mL
  Filled 2015-11-19: qty 5

## 2015-11-19 MED ORDER — HYDROCODONE-ACETAMINOPHEN 5-325 MG PO TABS
1.0000 | ORAL_TABLET | Freq: Once | ORAL | Status: AC
  Administered 2015-11-19: 1 via ORAL
  Filled 2015-11-19: qty 1

## 2015-11-19 MED ORDER — CEFTRIAXONE SODIUM 250 MG IJ SOLR
250.0000 mg | Freq: Once | INTRAMUSCULAR | Status: AC
  Administered 2015-11-19: 250 mg via INTRAMUSCULAR
  Filled 2015-11-19: qty 250

## 2015-11-19 MED ORDER — AZITHROMYCIN 250 MG PO TABS
1000.0000 mg | ORAL_TABLET | Freq: Once | ORAL | Status: AC
  Administered 2015-11-19: 1000 mg via ORAL
  Filled 2015-11-19: qty 4

## 2015-11-19 MED ORDER — IBUPROFEN 400 MG PO TABS
600.0000 mg | ORAL_TABLET | Freq: Once | ORAL | Status: AC
  Administered 2015-11-19: 600 mg via ORAL
  Filled 2015-11-19: qty 2

## 2015-11-19 NOTE — Discharge Instructions (Signed)
Pelvic Pain, Female Follow up tomorrow for an ultrasound. And follow up with the gynecologist. Return to the ED if you develop new or worsening symptoms. Female pelvic pain can be caused by many different things and start from a variety of places. Pelvic pain refers to pain that is located in the lower half of the abdomen and between your hips. The pain may occur over a short period of time (acute) or may be reoccurring (chronic). The cause of pelvic pain may be related to disorders affecting the female reproductive organs (gynecologic), but it may also be related to the bladder, kidney stones, an intestinal complication, or muscle or skeletal problems. Getting help right away for pelvic pain is important, especially if there has been severe, sharp, or a sudden onset of unusual pain. It is also important to get help right away because some types of pelvic pain can be life threatening.  CAUSES  Below are only some of the causes of pelvic pain. The causes of pelvic pain can be in one of several categories.   Gynecologic.  Pelvic inflammatory disease.  Sexually transmitted infection.  Ovarian cyst or a twisted ovarian ligament (ovarian torsion).  Uterine lining that grows outside the uterus (endometriosis).  Fibroids, cysts, or tumors.  Ovulation.  Pregnancy.  Pregnancy that occurs outside the uterus (ectopic pregnancy).  Miscarriage.  Labor.  Abruption of the placenta or ruptured uterus.  Infection.  Uterine infection (endometritis).  Bladder infection.  Diverticulitis.  Miscarriage related to a uterine infection (septic abortion).  Bladder.  Inflammation of the bladder (cystitis).  Kidney stone(s).  Gastrointestinal.  Constipation.  Diverticulitis.  Neurologic.  Trauma.  Feeling pelvic pain because of mental or emotional causes (psychosomatic).  Cancers of the bowel or pelvis. EVALUATION  Your caregiver will want to take a careful history of your concerns.  This includes recent changes in your health, a careful gynecologic history of your periods (menses), and a sexual history. Obtaining your family history and medical history is also important. Your caregiver may suggest a pelvic exam. A pelvic exam will help identify the location and severity of the pain. It also helps in the evaluation of which organ system may be involved. In order to identify the cause of the pelvic pain and be properly treated, your caregiver may order tests. These tests may include:   A pregnancy test.  Pelvic ultrasonography.  An X-ray exam of the abdomen.  A urinalysis or evaluation of vaginal discharge.  Blood tests. HOME CARE INSTRUCTIONS   Only take over-the-counter or prescription medicines for pain, discomfort, or fever as directed by your caregiver.   Rest as directed by your caregiver.   Eat a balanced diet.   Drink enough fluids to make your urine clear or pale yellow, or as directed.   Avoid sexual intercourse if it causes pain.   Apply warm or cold compresses to the lower abdomen depending on which one helps the pain.   Avoid stressful situations.   Keep a journal of your pelvic pain. Write down when it started, where the pain is located, and if there are things that seem to be associated with the pain, such as food or your menstrual cycle.  Follow up with your caregiver as directed.  SEEK MEDICAL CARE IF:  Your medicine does not help your pain.  You have abnormal vaginal discharge. SEEK IMMEDIATE MEDICAL CARE IF:   You have heavy bleeding from the vagina.   Your pelvic pain increases.   You feel light-headed or  faint.   You have chills.   You have pain with urination or blood in your urine.   You have uncontrolled diarrhea or vomiting.   You have a fever or persistent symptoms for more than 3 days.  You have a fever and your symptoms suddenly get worse.   You are being physically or sexually abused.   This  information is not intended to replace advice given to you by your health care provider. Make sure you discuss any questions you have with your health care provider.   Document Released: 05/13/2004 Document Revised: 03/07/2015 Document Reviewed: 10/06/2011 Else[employer] Interactive Patient Education [employer]

## 2015-11-19 NOTE — ED Provider Notes (Signed)
CSN: [id number]     Arrival date & time 11/19/15  1700 History   First MD Initiated Contact with Patient 11/19/15 1925     Chief Complaint  Patient presents with  . Abdominal Pain     (Consider location/radiation/quality/duration/timing/severity/associated sxs/prior Treatment) HPI Comments: Patient complains of central pelvic pain ongoing since 5 AM. Pain is constant. It is worse with movement. Nothing makes it better. Denies any nausea or vomiting. Denies any dysuria or hematuria. Denies any vaginal bleeding or discharge. She feels that her pain is due to ovarian cysts though these officially diagnosed. She denies any vaginal bleeding or discharge. She denies any possibility of pregnancy. She normal bowel movements. No right lower quadrant pain. No chest pain or shortness of breath. No fever. No previous abdominal surgeries. She was told by her gynecologist she should have an ultrasound but did not have it done due to cost.  The history is provided by the patient.    Past Medical History  Diagnosis Date  . UTI (lower urinary tract infection) 09/07/2014    4 in past 12 months. No cultures.  Culture 09/07/2014   . Chronic headaches    History reviewed. No pertinent past surgical history. Family History  Problem Relation Age of Onset  . Cancer Maternal Grandmother   . COPD Mother   . Emphysema Mother   . Thyroid disease Mother    Social History  Substance Use Topics  . Smoking status: Current Every Day Smoker -- 0.50 packs/day for 2 years    Types: Cigarettes  . Smokeless tobacco: Never Used  . Alcohol Use: 0.0 oz/week    0 Standard drinks or equivalent per week     Comment: occasionally   OB History    Gravida Para Term Preterm AB TAB SAB Ectopic Multiple Living   1    1  1         Review of Systems  Constitutional: Negative for fever, activity change and appetite change.  Respiratory: Negative for cough, chest tightness and shortness of breath.   Gastrointestinal: Positive  for abdominal pain. Negative for nausea and vomiting.  Genitourinary: Positive for pelvic pain and dyspareunia. Negative for dysuria, hematuria, vaginal bleeding and vaginal discharge.  Musculoskeletal: Positive for back pain. Negative for myalgias and arthralgias.  Skin: Negative for rash.  Neurological: Negative for dizziness, weakness and headaches.  A complete 10 system review of systems was obtained and all systems are negative except as noted in the HPI and PMH.      Allergies  Compazine and Latex  Home Medications   Prior to Admission medications   Medication Sig Start Date End Date Taking? Authorizing Provider  aspirin-acetaminophen-caffeine (EXCEDRIN EXTRA STRENGTH) [phone] MG tablet Take 2 tablets by mouth every 6 (six) hours as needed for headache.   Yes Historical Provider, MD  medroxyPROGESTERone (DEPO-PROVERA) 150 MG/ML injection TAKE TO THE DOCTOR FOR INJECTION EVERY 3 MONTHS Patient taking differently: Inject 150 mg into the muscle every 3 (three) months. TAKE TO THE DOCTOR FOR INJECTION EVERY 3 MONTHS 10/17/15  Yes Jacklyn ShellFrances Cresenzo-Dishmon, CNM  Phenyleph-Doxylamine-DM-APAP (ALKA SELTZER PLUS PO) Take 2 each by mouth daily as needed (GUMMIES).   Yes Historical Provider, MD   BP 110/73 mmHg  Pulse 73  Temp(Src) 98 F (36.7 C) (Oral)  Resp 16  Ht 5' (1.524 m)  Wt 140 lb (63.504 kg)  BMI 27.34 kg/m2  SpO2 99% Physical Exam  Constitutional: She is oriented to person, place, and time. She appears well-developed and  well-nourished. No distress.  HENT:  Head: Normocephalic and atraumatic.  Mouth/Throat: Oropharynx is clear and moist. No oropharyngeal exudate.  Eyes: Conjunctivae and EOM are normal. Pupils are equal, round, and reactive to light.  Neck: Normal range of motion. Neck supple.  No meningismus.  Cardiovascular: Normal rate, regular rhythm, normal heart sounds and intact distal pulses.   No murmur heard. Pulmonary/Chest: Effort normal and breath sounds  normal. No respiratory distress.  Abdominal: Soft. There is tenderness. There is no rebound and no guarding.  Suprapubic tenderness. No RLQ pain.  Genitourinary:  Chaperone present, normal external genitalia. Cervix is closed positive cervical motion tenderness. Midline uterine pain. No lateralizing adnexal tenderness.  Musculoskeletal: Normal range of motion. She exhibits no edema or tenderness.  Neurological: She is alert and oriented to person, place, and time. No cranial nerve deficit. She exhibits normal muscle tone. Coordination normal.  No ataxia on finger to nose bilaterally. No pronator drift. 5/5 strength throughout. CN 2-12 intact.Equal grip strength. Sensation intact.   Skin: Skin is warm.  Psychiatric: She has a normal mood and affect. Her behavior is normal.  Nursing note and vitals reviewed.   ED Course  Procedures (including critical care time) Labs Review Labs Reviewed  WET PREP, GENITAL - Abnormal; Notable for the following:    WBC, Wet Prep HPF POC MANY (*)    All other components within normal limits  CBC WITH DIFFERENTIAL/PLATELET - Abnormal; Notable for the following:    WBC 11.4 (*)    Lymphs Abs 4.3 (*)    All other components within normal limits  BASIC METABOLIC PANEL - Abnormal; Notable for the following:    Potassium 3.2 (*)    Glucose, Bld 105 (*)    All other components within normal limits  URINALYSIS, ROUTINE W REFLEX MICROSCOPIC (NOT AT [facility])  PREGNANCY, URINE  GC/CHLAMYDIA PROBE AMP ([place]) NOT AT [facility]    Imaging Review No results found. I have personally reviewed and evaluated these images and lab results as part of my medical decision-making.   EKG Interpretation None      MDM   Final diagnoses:  Pelvic pain in female   Pelvic pain since this morning without urinary symptoms. No vaginal bleeding or discharge.  Urinalysis negative. Pregnancy test negative.  Pelvic exam as above with cervical motion tenderness, no lateralizing  adnexal tenderness. We'll treat empirically for PID with Rocephin and Zithromax. Advised to have partners tested as well.  Arrange for pelvic ultrasound tomorrow. Low suspicion for ovarian torsion as there is no lateralizing adnexal pain and patient appears very comfortable. Follow-up with gynecology. Return precautions discussed.  Glynn Octave, MD 11/19/15 2252

## 2015-11-19 NOTE — ED Notes (Signed)
Pt given crackers and peanut butter and sprite  

## 2015-11-19 NOTE — ED Notes (Signed)
Patient complaining of lower middle abdominal pain starting this morning. States "they told me last time I was here that I had cysts on my ovaries but I didn't want to wait on the ultrasound and I couldn't follow up because my insurance doesn't cover it."

## 2015-11-20 ENCOUNTER — Ambulatory Visit (HOSPITAL_COMMUNITY)
Admission: RE | Admit: 2015-11-20 | Discharge: 2015-11-20 | Disposition: A | Discharge status: Home / Self Care | Payer: Medicaid Other | Source: Ambulatory Visit | Attending: Emergency Medicine | Admitting: Emergency Medicine

## 2015-11-20 ENCOUNTER — Other Ambulatory Visit (HOSPITAL_COMMUNITY): Payer: Self-pay

## 2015-11-20 ENCOUNTER — Other Ambulatory Visit (HOSPITAL_COMMUNITY): Payer: Self-pay | Admitting: Emergency Medicine

## 2015-11-20 DIAGNOSIS — R102 Pelvic and perineal pain: Secondary | ICD-10-CM

## 2015-11-20 DIAGNOSIS — R938 Abnormal findings on diagnostic imaging of other specified body structures: Secondary | ICD-10-CM | POA: Insufficient documentation

## 2015-11-21 LAB — GC/CHLAMYDIA PROBE AMP (~~LOC~~) NOT AT ARMC
Chlamydia: NEGATIVE
Neisseria Gonorrhea: NEGATIVE

## 2016-01-03 ENCOUNTER — Emergency Department (HOSPITAL_COMMUNITY)
Admission: EM | Admit: 2016-01-03 | Discharge: 2016-01-03 | Disposition: A | Discharge status: Home / Self Care | Payer: Medicaid Other | Attending: Emergency Medicine | Admitting: Emergency Medicine

## 2016-01-03 ENCOUNTER — Encounter (HOSPITAL_COMMUNITY): Payer: Self-pay | Admitting: Emergency Medicine

## 2016-01-03 DIAGNOSIS — H539 Unspecified visual disturbance: Secondary | ICD-10-CM | POA: Insufficient documentation

## 2016-01-03 DIAGNOSIS — Z7982 Long term (current) use of aspirin: Secondary | ICD-10-CM | POA: Insufficient documentation

## 2016-01-03 DIAGNOSIS — R55 Syncope and collapse: Secondary | ICD-10-CM

## 2016-01-03 DIAGNOSIS — F17210 Nicotine dependence, cigarettes, uncomplicated: Secondary | ICD-10-CM | POA: Insufficient documentation

## 2016-01-03 DIAGNOSIS — Z79818 Long term (current) use of other agents affecting estrogen receptors and estrogen levels: Secondary | ICD-10-CM | POA: Insufficient documentation

## 2016-01-03 LAB — CBC WITH DIFFERENTIAL/PLATELET
Basophils Absolute: 0.1 10*3/uL (ref 0.0–0.1)
Basophils Relative: 1 %
EOS ABS: 0.3 10*3/uL (ref 0.0–0.7)
EOS PCT: 5 %
HCT: 45.7 % (ref 36.0–46.0)
Hemoglobin: 15.9 g/dL — ABNORMAL HIGH (ref 12.0–15.0)
LYMPHS ABS: 1.9 10*3/uL (ref 0.7–4.0)
Lymphocytes Relative: 26 %
MCH: 31.3 pg (ref 26.0–34.0)
MCHC: 34.8 g/dL (ref 30.0–36.0)
MCV: 90 fL (ref 78.0–100.0)
MONO ABS: 0.7 10*3/uL (ref 0.1–1.0)
MONOS PCT: 10 %
Neutro Abs: 4.2 10*3/uL (ref 1.7–7.7)
Neutrophils Relative %: 58 %
PLATELETS: 235 10*3/uL (ref 150–400)
RBC: 5.08 MIL/uL (ref 3.87–5.11)
RDW: 12.8 % (ref 11.5–15.5)
WBC: 7.2 10*3/uL (ref 4.0–10.5)

## 2016-01-03 LAB — URINALYSIS, ROUTINE W REFLEX MICROSCOPIC
BILIRUBIN URINE: NEGATIVE
GLUCOSE, UA: NEGATIVE mg/dL
HGB URINE DIPSTICK: NEGATIVE
Ketones, ur: NEGATIVE mg/dL
Leukocytes, UA: NEGATIVE
Nitrite: NEGATIVE
PH: 6 (ref 5.0–8.0)
Protein, ur: NEGATIVE mg/dL

## 2016-01-03 LAB — COMPREHENSIVE METABOLIC PANEL
ALT: 28 U/L (ref 14–54)
AST: 16 U/L (ref 15–41)
Albumin: 4.4 g/dL (ref 3.5–5.0)
Alkaline Phosphatase: 47 U/L (ref 38–126)
Anion gap: 5 (ref 5–15)
BUN: 10 mg/dL (ref 6–20)
CHLORIDE: 106 mmol/L (ref 101–111)
CO2: 27 mmol/L (ref 22–32)
CREATININE: 0.62 mg/dL (ref 0.44–1.00)
Calcium: 9.1 mg/dL (ref 8.9–10.3)
Glucose, Bld: 103 mg/dL — ABNORMAL HIGH (ref 65–99)
POTASSIUM: 3.8 mmol/L (ref 3.5–5.1)
Sodium: 138 mmol/L (ref 135–145)
Total Bilirubin: 0.4 mg/dL (ref 0.3–1.2)
Total Protein: 7.5 g/dL (ref 6.5–8.1)

## 2016-01-03 LAB — CBG MONITORING, ED: Glucose-Capillary: 142 mg/dL — ABNORMAL HIGH (ref 65–99)

## 2016-01-03 LAB — POC URINE PREG, ED: PREG TEST UR: NEGATIVE

## 2016-01-03 MED ORDER — SODIUM CHLORIDE 0.9 % IV BOLUS (SEPSIS)
1000.0000 mL | Freq: Once | INTRAVENOUS | Status: AC
  Administered 2016-01-03: 1000 mL via INTRAVENOUS

## 2016-01-03 NOTE — Discharge Instructions (Signed)
Rest at home today and follow up with a family md if needed

## 2016-01-03 NOTE — ED Notes (Signed)
Pt c/o waking up feeling shaky and dizzy yesterday. Pt reports having a syncopal episode yesterday. Pt states she woke up with some dizziness today, and feels "shaky and hot".

## 2016-01-03 NOTE — ED Provider Notes (Signed)
CSN: [id number]     Arrival date & time 01/03/16  [id number] History  By signing my name below, I, Carla Schaefer, attest that this documentation has been prepared under the direction and in the presence of Bethann BerkshireJoseph Yocelyn Brocious, MD.  Electronically Signed: Gillis EndsJasmyn B. Lyn HollingsheadAlexander, ED Scribe. 01/03/2016. 8:32 AM.      Chief Complaint  Patient presents with  . Loss of Consciousness   Patient is a 20 y.o. female presenting with syncope. The history is provided by the patient. No language interpreter was used.  Loss of Consciousness Episode history:  Single Most recent episode:  Yesterday Duration:  2 minutes Progression:  Partially resolved Chronicity:  New Witnessed: yes   Relieved by:  Nothing Worsened by:  Nothing tried Ineffective treatments:  None tried Associated symptoms: dizziness and visual change   Associated symptoms: no chest pain, no fever, no headaches and no seizures    HPI Comments: Newton PiggSamantha M Schaefer is a 20 y.o. female who presents to the Emergency Department complaining of intermittent, partially resolved, syncopal episodes x 1 day. Pt states she woke up on 01/02/16 having dizziness, tremors, and "feeling hot." She reports taking her temperature with it being 97.6. She states that she had 1 syncopal episode yesterday that lasted 1-2 minutes, witnessed by her mother. No head injury or fall. She notes that syncope has resolved but she still has associated symptoms of dizziness, blurred vision, and tremors. Pt thought her blood pressure was low, which she believes could have possibly caused the episode. There are no modifying factors. Denies recently being outside for prolonged amount of time. She states she receives the DEPO birth control shot so she does not have a regular menstrual cycle.  Past Medical History  Diagnosis Date  . UTI (lower urinary tract infection) 09/07/2014    4 in past 12 months. No cultures.  Culture 09/07/2014   . Chronic headaches    History reviewed. No pertinent  past surgical history. Family History  Problem Relation Age of Onset  . Cancer Maternal Grandmother   . COPD Mother   . Emphysema Mother   . Thyroid disease Mother    Social History  Substance Use Topics  . Smoking status: Current Every Day Smoker -- 0.50 packs/day for 2 years    Types: Cigarettes  . Smokeless tobacco: Never Used  . Alcohol Use: 0.0 oz/week    0 Standard drinks or equivalent per week     Comment: occasionally   OB History    Gravida Para Term Preterm AB TAB SAB Ectopic Multiple Living   1    1  1         Review of Systems  Constitutional: Negative for fever, appetite change and fatigue.  HENT: Negative for congestion, ear discharge and sinus pressure.   Eyes: Positive for visual disturbance. Negative for discharge.  Respiratory: Negative for cough.   Cardiovascular: Positive for syncope. Negative for chest pain.  Gastrointestinal: Negative for abdominal pain and diarrhea.  Genitourinary: Negative for frequency and hematuria.  Musculoskeletal: Negative for back pain.  Skin: Negative for rash.  Neurological: Positive for dizziness, tremors and syncope. Negative for seizures and headaches.  Psychiatric/Behavioral: Negative for hallucinations.  All other systems reviewed and are negative.  Allergies  Compazine and Latex  Home Medications   Prior to Admission medications   Medication Sig Start Date End Date Taking? Authorizing Provider  aspirin-acetaminophen-caffeine (EXCEDRIN EXTRA STRENGTH) [phone] MG tablet Take 2 tablets by mouth every 6 (six) hours as needed for  headache.   Yes Historical Provider, MD  medroxyPROGESTERone (DEPO-PROVERA) 150 MG/ML injection TAKE TO THE DOCTOR FOR INJECTION EVERY 3 MONTHS Patient taking differently: Inject 150 mg into the muscle every 3 (three) months. TAKE TO THE DOCTOR FOR INJECTION EVERY 3 MONTHS 10/17/15  Yes Jacklyn ShellFrances Cresenzo-Dishmon, CNM   BP 112/85 mmHg  Pulse 110  Temp(Src) 98.2 F (36.8 C)  Resp 18  Ht 5'  (1.524 m)  Wt 140 lb (63.504 kg)  BMI 27.34 kg/m2  SpO2 100% Physical Exam  Constitutional: She is oriented to person, place, and time. She appears well-developed.  HENT:  Head: Normocephalic.  Eyes: Conjunctivae and EOM are normal. No scleral icterus.  Neck: Neck supple. No thyromegaly present.  Cardiovascular: Normal rate and regular rhythm.  Exam reveals no gallop and no friction rub.   No murmur heard. Pulmonary/Chest: No stridor. She has no wheezes. She has no rales. She exhibits no tenderness.  Abdominal: She exhibits no distension. There is no tenderness. There is no rebound.  Musculoskeletal: Normal range of motion. She exhibits no edema.  Lymphadenopathy:    She has no cervical adenopathy.  Neurological: She is oriented to person, place, and time. She exhibits normal muscle tone. Coordination normal.  Skin: No rash noted. No erythema.  Psychiatric: She has a normal mood and affect. Her behavior is normal.    ED Course  Procedures (including critical care time) DIAGNOSTIC STUDIES: Oxygen Saturation is 100% on RA, normal by my interpretation.    COORDINATION OF CARE: 8:25 AM-Discussed treatment plan which includes CBC, UA, CMP, and POCT Pregnancy with pt at bedside and pt agreed to plan.   Labs Review Labs Reviewed  URINALYSIS, ROUTINE W REFLEX MICROSCOPIC (NOT AT [facility]) - Abnormal; Notable for the following:    Specific Gravity, Urine <1.005 (*)    All other components within normal limits  CBC WITH DIFFERENTIAL/PLATELET - Abnormal; Notable for the following:    Hemoglobin 15.9 (*)    All other components within normal limits  COMPREHENSIVE METABOLIC PANEL - Abnormal; Notable for the following:    Glucose, Bld 103 (*)    All other components within normal limits  CBG MONITORING, ED - Abnormal; Notable for the following:    Glucose-Capillary 142 (*)    All other components within normal limits  POC URINE PREG, ED    MDM   Final diagnoses:  None    Patient  with weakness and syncope yesterday. EKG and labs unremarkable possible viral infection. Patient feeling some better now. She will return to work tomorrow  Bethann BerkshireJoseph Rian Busche, MD 01/03/16 1049

## 2016-01-09 ENCOUNTER — Encounter: Payer: Self-pay | Admitting: Obstetrics & Gynecology

## 2016-01-09 ENCOUNTER — Ambulatory Visit: Payer: Medicaid Other

## 2016-07-09 IMAGING — US US TRANSVAGINAL NON-OB
1 series · 13 of 25 positions shown · non-contrast
Comparison: None.

CLINICAL DATA: Pelvic pain. No history of pregnancy. Patient is on
Depo Provera with last menstrual period being 2 years ago.

EXAM:
TRANSABDOMINAL AND TRANSVAGINAL ULTRASOUND OF PELVIS
DOPPLER ULTRASOUND OF OVARIES
TECHNIQUE: Both transabdominal and transvaginal ultrasound examinations of the
pelvis were performed. Transabdominal technique was performed for
global imaging of the pelvis including uterus, ovaries, adnexal
regions, and pelvic cul-de-sac.
It was necessary to proceed with endovaginal exam following the
transabdominal exam to visualize the adnexa. Color and duplex
Doppler ultrasound was utilized to evaluate blood flow to the
ovaries.

[Series 1: us transvaginal non-ob · 0.25mm/px · 13 of 77 slices shown]
[im 1/77]
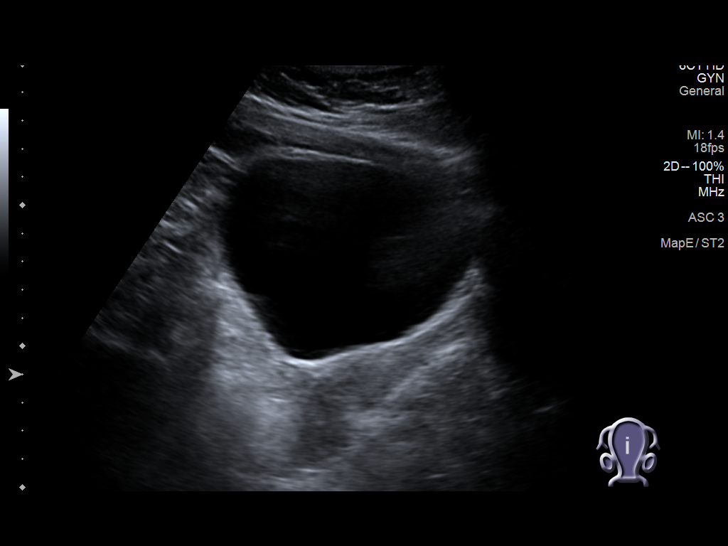
[im 7/77]
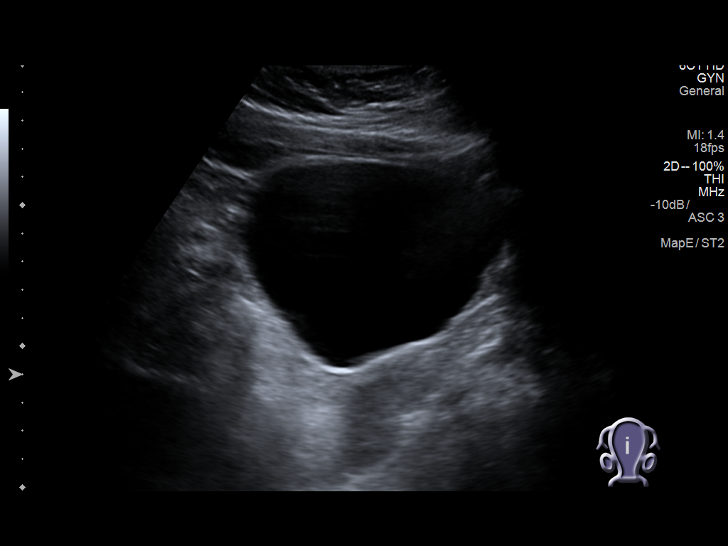
[im 13/77]
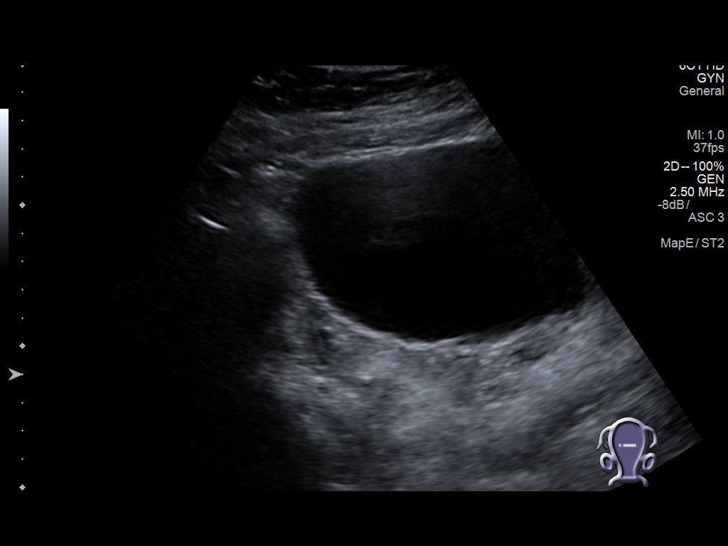
[im 20/77]
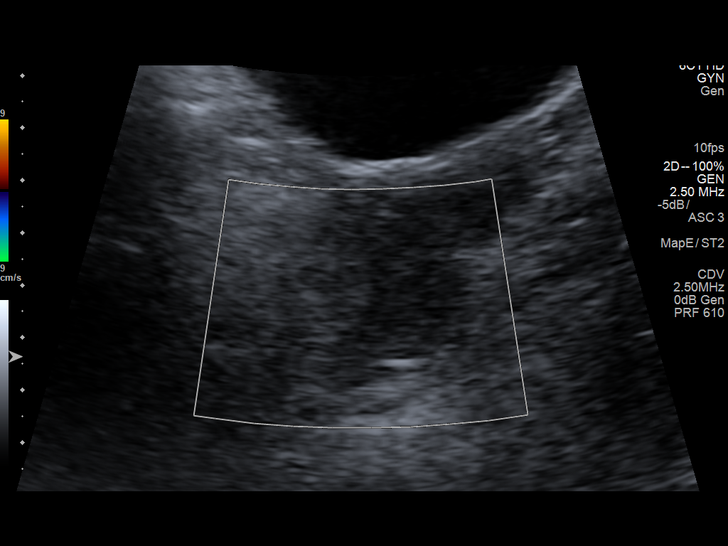
[im 26/77]
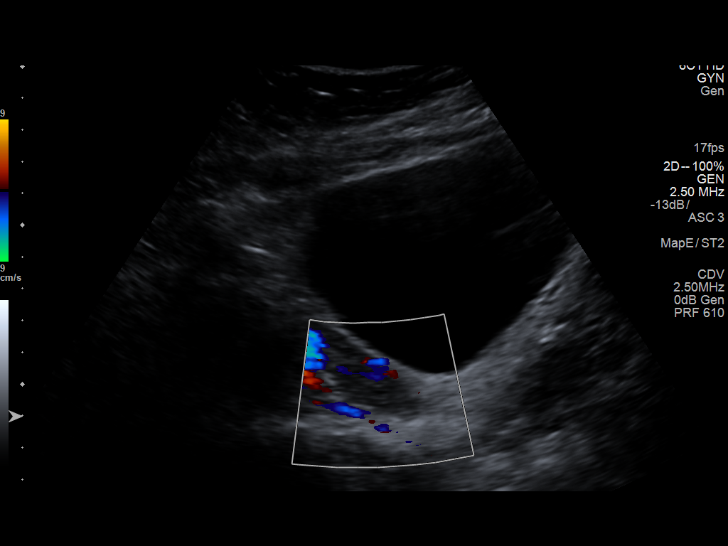
[im 32/77]
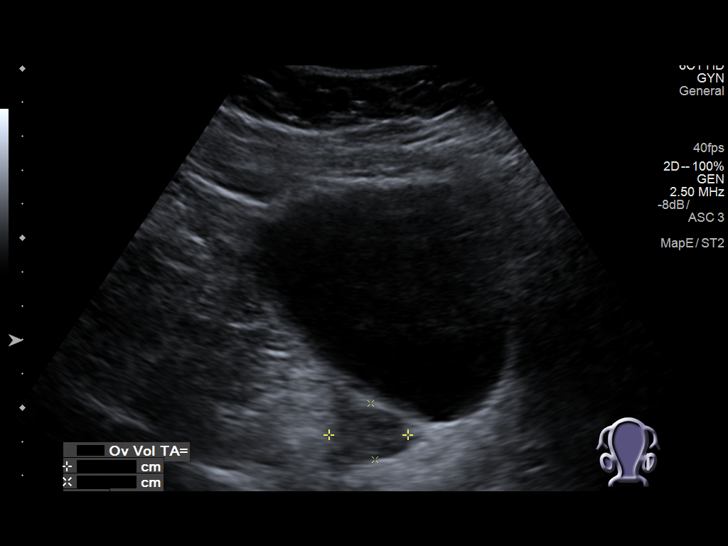
[im 39/77]
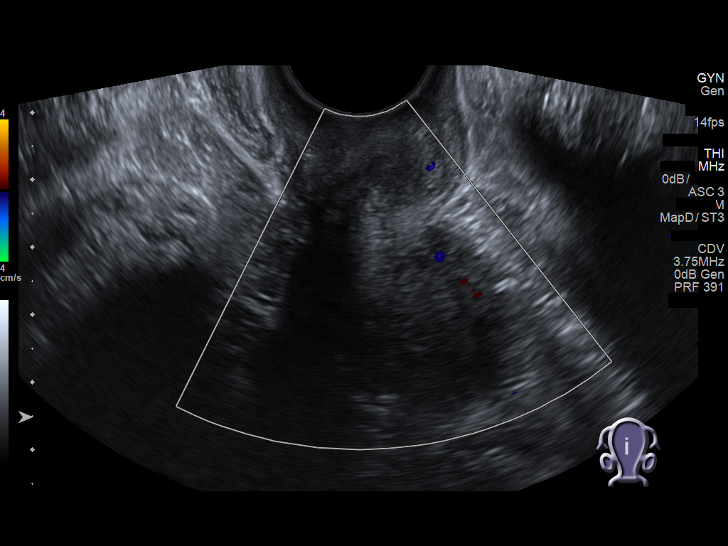
[im 45/77]
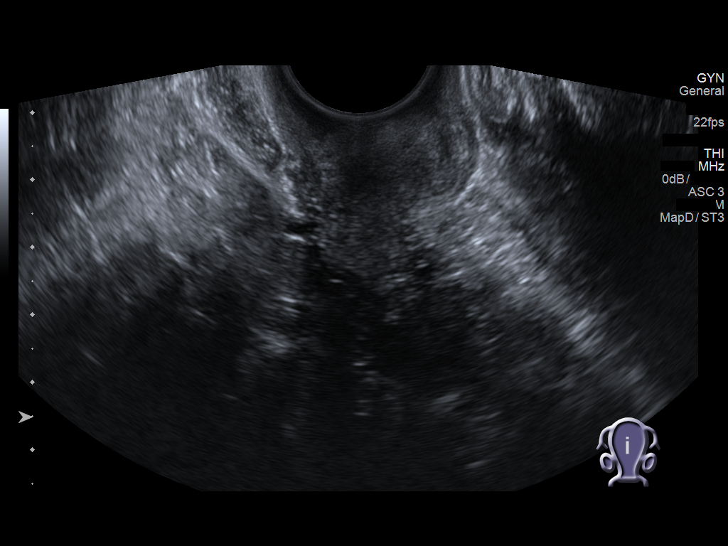
[im 51/77]
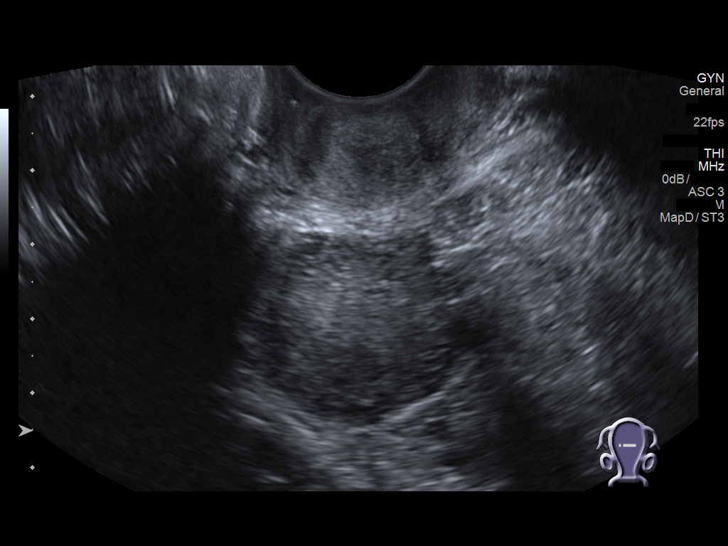
[im 58/77]
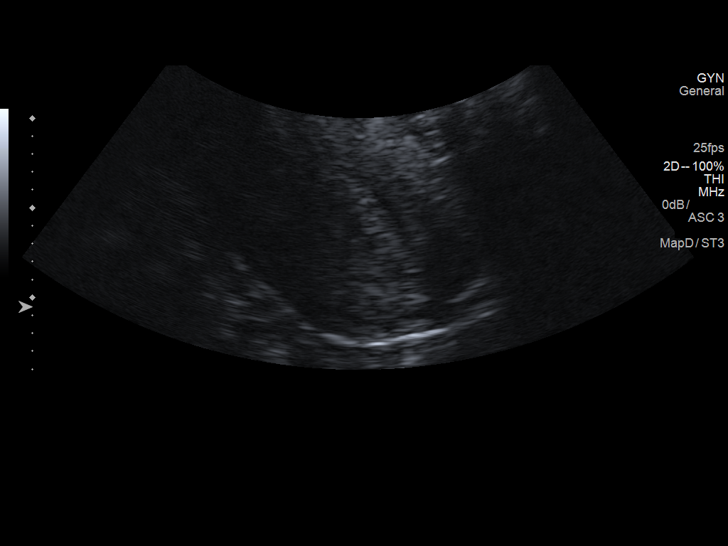
[im 64/77]
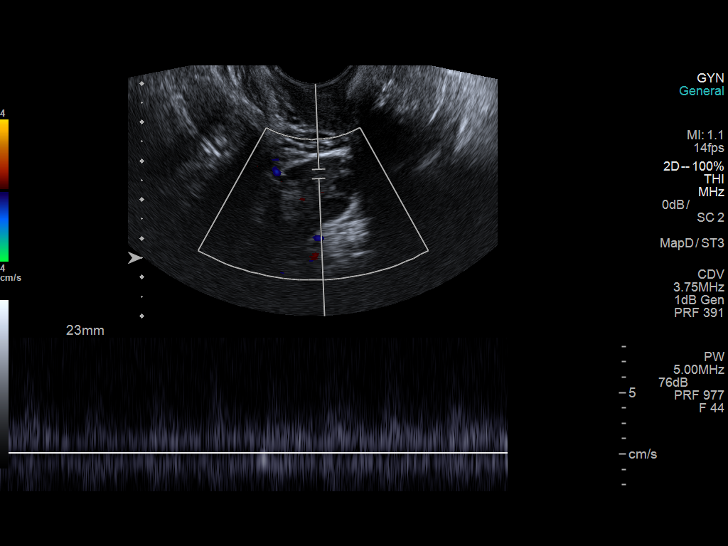
[im 70/77]
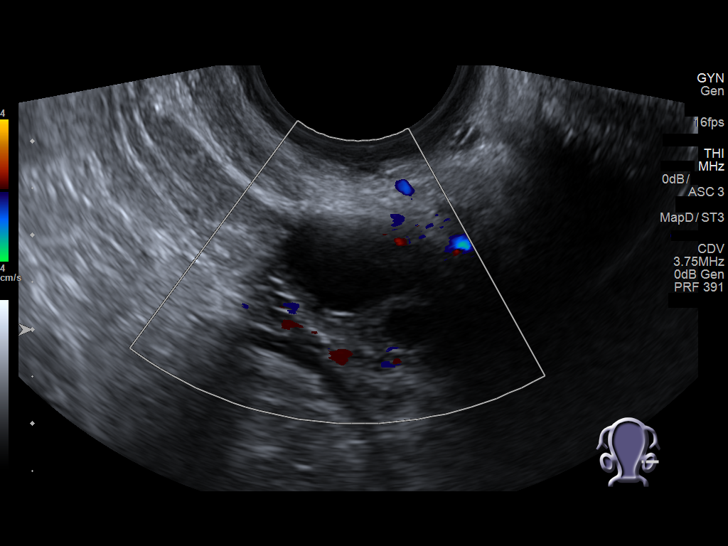
[im 77/77]
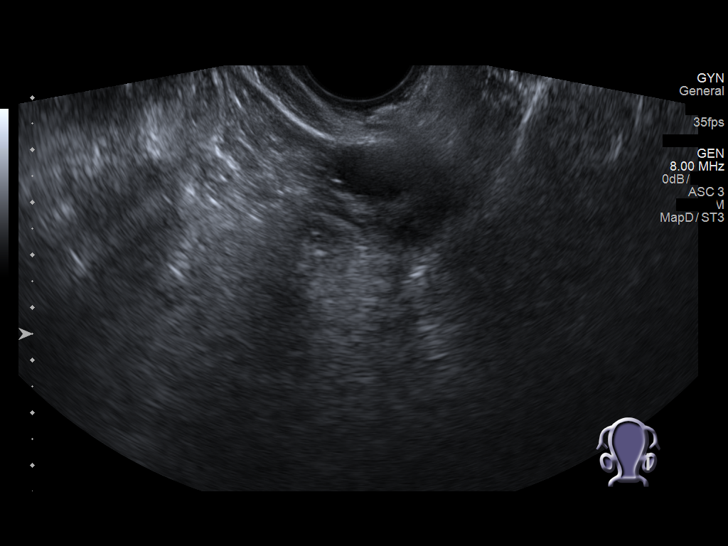

[13 of 25 positions shown; findings below may reference images not displayed]

FINDINGS: Uterus

Measurements: 5.5 x 2.7 x 2.6 cm. No fibroids or other mass
visualized.

Endometrium

Thickness: 4 mm. Small amount of fluid is seen within the
endometrial canal. No focal abnormality visualized.

Right ovary

Measurements: 3.3 x 1.6 x 1.6 cm. Normal appearance/no adnexal mass.
Small likely physiologic less than 11 mm cysts are noted.

Left ovary

Measurements: 2.3 x 1.7 x 2.2 cm.. Normal appearance/no adnexal
mass. Small likely physiologic less than 15 mm cysts are noted.

Pulsed Doppler evaluation of both ovaries demonstrates normal
low-resistance arterial and venous waveforms.

Other findings

No abnormal free fluid.
IMPRESSION: Normal pelvic ultrasound.

Small amount of fluid within the endometrial canal. Please correlate
clinically.

## 2016-09-30 ENCOUNTER — Other Ambulatory Visit: Payer: Medicaid Other | Admitting: Adult Health

## 2016-10-07 ENCOUNTER — Encounter: Payer: Self-pay | Admitting: Advanced Practice Midwife

## 2016-10-07 ENCOUNTER — Ambulatory Visit (INDEPENDENT_AMBULATORY_CARE_PROVIDER_SITE_OTHER): Payer: Medicaid Other | Admitting: Advanced Practice Midwife

## 2016-10-07 ENCOUNTER — Other Ambulatory Visit (HOSPITAL_COMMUNITY)
Admission: RE | Admit: 2016-10-07 | Discharge: 2016-10-07 | Disposition: A | Discharge status: Home / Self Care | Payer: Medicaid Other | Source: Ambulatory Visit | Attending: Advanced Practice Midwife | Admitting: Advanced Practice Midwife

## 2016-10-07 ENCOUNTER — Encounter (INDEPENDENT_AMBULATORY_CARE_PROVIDER_SITE_OTHER): Payer: Self-pay

## 2016-10-07 VITALS — BP 118/66 | HR 86 | Ht 60.0 in | Wt 127.0 lb

## 2016-10-07 DIAGNOSIS — Z3009 Encounter for other general counseling and advice on contraception: Secondary | ICD-10-CM | POA: Insufficient documentation

## 2016-10-07 DIAGNOSIS — Z01419 Encounter for gynecological examination (general) (routine) without abnormal findings: Secondary | ICD-10-CM

## 2016-10-07 MED ORDER — NORETHIN-ETH ESTRAD-FE BIPHAS 1 MG-10 MCG / 10 MCG PO TABS: 1.0000 | ORAL_TABLET | Freq: Every day | ORAL | 11 refills | Status: DC

## 2016-10-07 NOTE — Progress Notes (Signed)
Carla Schaefer 21 y.o.  Vitals:   10/07/16 1047  BP: 118/66  Pulse: 86     Filed Weights   10/07/16 1047  Weight: 127 lb (57.6 kg)    Past Medical History: Past Medical History:  Diagnosis Date  . Chronic headaches   . UTI (lower urinary tract infection) 09/07/2014   4 in past 12 months. No cultures.  Culture 09/07/2014     Past Surgical History: History reviewed. No pertinent surgical history.  Family History: Family History  Problem Relation Age of Onset  . Cancer Maternal Grandmother   . COPD Mother   . Emphysema Mother   . Thyroid disease Mother     Social History: Social History  Substance Use Topics  . Smoking status: Current Every Day Smoker    Packs/day: 0.50    Years: 2.00    Types: Cigarettes  . Smokeless tobacco: Never Used  . Alcohol use 0.0 oz/week     Comment: occasionally    Allergies:  Allergies  Allergen Reactions  . Compazine [Prochlorperazine Edisylate]     Nausea and vomiting  . Latex       Current Outpatient Prescriptions:  .  aspirin-acetaminophen-caffeine (EXCEDRIN EXTRA STRENGTH) 250-250-65 MG tablet, Take 2 tablets by mouth every 6 (six) hours as needed for headache., Disp: , Rfl:  .  Norethindrone-Ethinyl Estradiol-Fe Biphas (LO LOESTRIN FE) 1 MG-10 MCG / 10 MCG tablet, Take 1 tablet by mouth daily., Disp: 1 Package, Rfl: 11  History of Present Illness: Here for first pap.  Is not on birth control.  Wants to start pills    Review of Systems   Patient denies any headaches, blurred vision, shortness of breath, chest pain, abdominal pain, problems with bowel movements, urination, or intercourse. Feels like she may have endometriosis d/t periods hurting so much and abdominal tenderness.  ? Some of it GI?    Physical Exam: General:  Well developed, well nourished, no acute distress Skin:  Warm and dry Neck:  Midline trachea, normal thyroid Lungs; Clear to auscultation bilaterally Breast:  No dominant palpable mass,  retraction, or nipple discharge Cardiovascular: Regular rate and rhythm Abdomen:  Soft, non tender, no hepatosplenomegaly Pelvic:  External genitalia is normal in appearance.  The vagina is normal in appearance.  The cervix is nulliparous Uterus is felt to be normal size, shape, and contour.  No adnexal masses or tenderness noted.  Extremities:  No swelling or varicosities noted Psych:  No mood changes.     Impression: [facility] exam "    Plan: if pap normal, repeat q 3 years.  Discussed HPV vaccine "doesn't do shots".     Start Loloestrin now

## 2016-10-09 LAB — CYTOLOGY - PAP
CHLAMYDIA, DNA PROBE: NEGATIVE
Diagnosis: NEGATIVE
Neisseria Gonorrhea: NEGATIVE

## 2017-06-06 ENCOUNTER — Emergency Department (HOSPITAL_COMMUNITY)
Admission: EM | Admit: 2017-06-06 | Discharge: 2017-06-06 | Disposition: A | Discharge status: Home / Self Care | Payer: Self-pay | Attending: Emergency Medicine | Admitting: Emergency Medicine

## 2017-06-06 ENCOUNTER — Other Ambulatory Visit: Payer: Self-pay

## 2017-06-06 ENCOUNTER — Encounter (HOSPITAL_COMMUNITY): Payer: Self-pay | Admitting: Emergency Medicine

## 2017-06-06 DIAGNOSIS — Y92009 Unspecified place in unspecified non-institutional (private) residence as the place of occurrence of the external cause: Secondary | ICD-10-CM | POA: Insufficient documentation

## 2017-06-06 DIAGNOSIS — Y999 Unspecified external cause status: Secondary | ICD-10-CM | POA: Insufficient documentation

## 2017-06-06 DIAGNOSIS — Z91040 Latex allergy status: Secondary | ICD-10-CM | POA: Insufficient documentation

## 2017-06-06 DIAGNOSIS — S0181XA Laceration without foreign body of other part of head, initial encounter: Secondary | ICD-10-CM

## 2017-06-06 DIAGNOSIS — F17210 Nicotine dependence, cigarettes, uncomplicated: Secondary | ICD-10-CM | POA: Insufficient documentation

## 2017-06-06 DIAGNOSIS — W540XXA Bitten by dog, initial encounter: Secondary | ICD-10-CM | POA: Insufficient documentation

## 2017-06-06 DIAGNOSIS — Y9389 Activity, other specified: Secondary | ICD-10-CM | POA: Insufficient documentation

## 2017-06-06 DIAGNOSIS — S01112A Laceration without foreign body of left eyelid and periocular area, initial encounter: Secondary | ICD-10-CM | POA: Insufficient documentation

## 2017-06-06 MED ORDER — LIDOCAINE HCL (PF) 1 % IJ SOLN
INTRAMUSCULAR | Status: AC
  Filled 2017-06-06: qty 6

## 2017-06-06 MED ORDER — POVIDONE-IODINE 10 % EX SOLN
CUTANEOUS | Status: AC
  Filled 2017-06-06: qty 15

## 2017-06-06 MED ORDER — LIDOCAINE-EPINEPHRINE (PF) 1 %-1:200000 IJ SOLN
10.0000 mL | Freq: Once | INTRAMUSCULAR | Status: DC
  Filled 2017-06-06: qty 30

## 2017-06-06 NOTE — ED Triage Notes (Signed)
Pts dog bite pt's left eye. Bleeding controlled. Swelling to left eyelid.

## 2017-06-06 NOTE — ED Provider Notes (Signed)
[facility] EMERGENCY DEPARTMENT Provider Note   CSN: [id number] Arrival date & time: 06/06/17  1705     History   Chief Complaint Chief Complaint  Patient presents with  . Laceration    HPI Carla Schaefer is a 21 y.o. female.  She was accidentally bitten by her dog, who was running to see what the certain commotion was, after the patient placed her cat into her dryer, accidentally.  The patient's only injury was to the left upper eyelid.  She denies vision changes, headache, weakness or dizziness.  There are no other known modifying factors.  HPI  Past Medical History:  Diagnosis Date  . Chronic headaches   . UTI (lower urinary tract infection) 09/07/2014   4 in past 12 months. No cultures.  Culture 09/07/2014     Patient Active Problem List   Diagnosis Date Noted  . Pelvic pain in female 03/21/2015  . UTI (lower urinary tract infection) 09/07/2014  . Dyspareunia 09/07/2014    History reviewed. No pertinent surgical history.  OB History    Gravida Para Term Preterm AB Living   1       1     SAB TAB Ectopic Multiple Live Births   1               Home Medications    Prior to Admission medications   Medication Sig Start Date End Date Taking? Authorizing Provider  acetaminophen (PAIN RELIEF) 500 MG tablet Take 500-1,000 mg by mouth every 6 (six) hours as needed for mild pain.   Yes [provider]    Family History Family History  Problem Relation Age of Onset  . Cancer Maternal Grandmother   . COPD Mother   . Emphysema Mother   . Thyroid disease Mother     Social History Social History   Tobacco Use  . Smoking status: Current Every Day Smoker    Packs/day: 1.00    Years: 2.00    Pack years: 2.00    Types: Cigarettes  . Smokeless tobacco: Never Used  Substance Use Topics  . Alcohol use: Yes    Alcohol/week: 0.0 oz    Comment: occasionally  . Drug use: Yes    Types: Marijuana     Allergies   Compazine [prochlorperazine  edisylate] and Latex   Review of Systems Review of Systems  All other systems reviewed and are negative.    Physical Exam Updated Vital Signs BP 136/84 (BP Location: Right Arm)   Pulse (!) 110   Temp 98.7 F (37.1 C) (Oral)   Resp 18   Ht 5' (1.524 m)   Wt 63.5 kg (140 lb)   LMP 06/06/2017   SpO2 98%   BMI 27.34 kg/m   Physical Exam  Constitutional: She is oriented to person, place, and time. She appears well-developed and well-nourished.  HENT:  Head: Normocephalic and atraumatic.  Gaping laceration left upper eyelid.  Eyes: Conjunctivae and EOM are normal. Pupils are equal, round, and reactive to light.  Neck: Normal range of motion and phonation normal. Neck supple.  Cardiovascular: Normal rate.  Pulmonary/Chest: Effort normal.  Musculoskeletal: Normal range of motion.  Neurological: She is alert and oriented to person, place, and time. She exhibits normal muscle tone.  Skin: Skin is warm and dry.  Psychiatric: She has a normal mood and affect. Her behavior is normal. Judgment and thought content normal.  Nursing note and vitals reviewed.    ED Treatments / Results  Labs (  all labs ordered are listed, but only abnormal results are displayed) Labs Reviewed - No data to display  EKG  EKG Interpretation None       Radiology No results found.  Procedures .Marland Kitchen.Laceration Repair Date/Time: 06/06/2017 7:57 PM Performed by: Mancel BaleWentz, Braiden Rodman, MD Authorized by: Mancel BaleWentz, Chyane Greer, MD   Consent:    Consent obtained:  Verbal   Risks discussed:  Infection, pain, poor cosmetic result and need for additional repair   Alternatives discussed:  No treatment and observation Anesthesia (see MAR for exact dosages):    Anesthesia method:  Local infiltration   Local anesthetic:  Lidocaine 2% WITH epi Laceration details:    Location: Left upper eyelid.   Length (cm):  2 Repair type:    Repair type:  Intermediate Pre-procedure details:    Preparation:  Patient was prepped and  draped in usual sterile fashion Exploration:    Hemostasis achieved with:  Direct pressure   Wound exploration: wound explored through full range of motion     Wound extent: no muscle damage noted, no nerve damage noted, no underlying fracture noted and no vascular damage noted     Contaminated: no   Treatment:    Area cleansed with:  Betadine   Amount of cleaning:  Standard   Irrigation solution:  Sterile water   Visualized foreign bodies/material removed: no   Skin repair:    Repair method:  Sutures   Suture size:  6-0   Suture material:  Prolene   Suture technique:  Simple interrupted   Number of sutures:  4 Approximation:    Approximation:  Loose   Vermilion border: well-aligned   Post-procedure details:    Dressing:  Open (no dressing) Comments:     Wound edges were irregular with a small flap laceration, about 0.5 cm, triangular.  The tip of this triangle was nonviable and was excised, with debridement.  Flap was sutured with 2 stitches.  The remaining laceration closed well.   (including critical care time)  Medications Ordered in ED Medications  lidocaine-EPINEPHrine (XYLOCAINE-EPINEPHrine) 1 %-1:200000 (PF) injection 10 mL (not administered)  povidone-iodine (BETADINE) 10 % external solution (not administered)  lidocaine (PF) (XYLOCAINE) 1 % injection (not administered)     Initial Impression / Assessment and Plan / ED Course  I have reviewed the triage vital signs and the nursing notes.  Pertinent labs & imaging results that were available during my care of the patient were reviewed by me and considered in my medical decision making (see chart for details).      Patient Vitals for the past 24 hrs:  BP Temp Temp src Pulse Resp SpO2 Height Weight  06/06/17 1720 - - - - - - 5' (1.524 m) 63.5 kg (140 lb)  06/06/17 1718 136/84 98.7 F (37.1 C) Oral (!) 110 18 98 % - -    7:59 PM Reevaluation with update and discussion. After initial assessment and treatment, an  updated evaluation reveals patient is comfortable has no further complaints.  Findings discussed and questions answered. Mancel BaleElliott Catalyna Reilly      Final Clinical Impressions(s) / ED Diagnoses   Final diagnoses:  Facial laceration, initial encounter   Eyelid laceration, requiring suture repair.  Nursing Notes Reviewed/ Care Coordinated Applicable Imaging Reviewed Interpretation of Laboratory Data incorporated into ED treatment  The patient appears reasonably screened and/or stabilized for discharge and I doubt any other medical condition or other [facility] requiring further screening, evaluation, or treatment in the ED at this time prior to  discharge.  Plan: Home Medications-OTC analgesia; Home Treatments-wound care at home; return here if the recommended treatment, does not improve the symptoms; Recommended follow up-PCP as needed and suture removal 4-6 days   ED Discharge Orders    None       Mancel BaleWentz, Leanny Moeckel, MD 06/06/17 2001

## 2017-06-06 NOTE — Discharge Instructions (Signed)
Keep the wound clean with soap and water daily.  Apply a small amount of topical antibiotic to the wound edges to help it heal.  Use ice on the sore area 3 times a day, for 2 days.  Have the sutures removed in 4-6 days.

## 2017-06-06 NOTE — ED Notes (Signed)
Source blood drawn no pt due to employee being stuck by needle that was used on this pt

## 2017-06-06 NOTE — ED Notes (Signed)
edp in now to suture

## 2017-06-11 ENCOUNTER — Encounter (HOSPITAL_COMMUNITY): Payer: Self-pay | Admitting: *Deleted

## 2017-06-11 ENCOUNTER — Other Ambulatory Visit: Payer: Self-pay

## 2017-06-11 ENCOUNTER — Emergency Department (HOSPITAL_COMMUNITY)
Admission: EM | Admit: 2017-06-11 | Discharge: 2017-06-11 | Disposition: A | Discharge status: Home / Self Care | Payer: Self-pay | Attending: Emergency Medicine | Admitting: Emergency Medicine

## 2017-06-11 DIAGNOSIS — Z4802 Encounter for removal of sutures: Secondary | ICD-10-CM | POA: Insufficient documentation

## 2017-06-11 DIAGNOSIS — F17210 Nicotine dependence, cigarettes, uncomplicated: Secondary | ICD-10-CM | POA: Insufficient documentation

## 2017-06-11 DIAGNOSIS — Z91040 Latex allergy status: Secondary | ICD-10-CM | POA: Insufficient documentation

## 2017-06-11 HISTORY — DX: Anxiety disorder, unspecified: F41.9

## 2017-06-11 NOTE — ED Triage Notes (Signed)
Pt requesting suture removal from left eyelid. Sutures placed 06/06/17. No redness or drainage around suture site.

## 2017-06-11 NOTE — Discharge Instructions (Signed)
Cool compresses on/off.  The laceration appears to be healing well.  Return here if needed

## 2017-06-11 NOTE — ED Provider Notes (Signed)
[facility] EMERGENCY DEPARTMENT Provider Note   CSN: [id number] Arrival date & time: 06/11/17  1126     History   Chief Complaint Chief Complaint  Patient presents with  . Suture / Staple Removal    HPI Newton PiggSamantha M Wrinkle is a 21 y.o. female.  HPI   Newton PiggSamantha M Matera is a 21 y.o. female who presents to the Emergency Department requesting suture removal from a laceration to the left upper eyelid.  She was seen here 06/06/17 for a laceration to her eyelid that occurred from a bite by her own dog.  She states the laceration has been healing well but continues to be slightly swollen.  She states that she has been unable to apply ice packs or cool compresses because anything she applies to her eyelid irritates the sutures.  She denies visual changes, dizziness, headaches, fever or chills.  Past Medical History:  Diagnosis Date  . Anxiety   . Chronic headaches   . UTI (lower urinary tract infection) 09/07/2014   4 in past 12 months. No cultures.  Culture 09/07/2014     Patient Active Problem List   Diagnosis Date Noted  . Pelvic pain in female 03/21/2015  . UTI (lower urinary tract infection) 09/07/2014  . Dyspareunia 09/07/2014    History reviewed. No pertinent surgical history.  OB History    Gravida Para Term Preterm AB Living   1       1     SAB TAB Ectopic Multiple Live Births   1               Home Medications    Prior to Admission medications   Medication Sig Start Date End Date Taking? Authorizing Provider  acetaminophen (PAIN RELIEF) 500 MG tablet Take 500-1,000 mg by mouth every 6 (six) hours as needed for mild pain.    [provider]    Family History Family History  Problem Relation Age of Onset  . Cancer Maternal Grandmother   . COPD Mother   . Emphysema Mother   . Thyroid disease Mother     Social History Social History   Tobacco Use  . Smoking status: Current Every Day Smoker    Packs/day: 1.00    Years: 2.00    Pack years: 2.00      Types: Cigarettes  . Smokeless tobacco: Never Used  Substance Use Topics  . Alcohol use: Yes    Alcohol/week: 0.0 oz    Comment: occasionally  . Drug use: Yes    Types: Marijuana     Allergies   Compazine [prochlorperazine edisylate] and Latex   Review of Systems Review of Systems  Constitutional: Negative for chills and fever.  Eyes: Negative for visual disturbance.  Musculoskeletal: Negative for arthralgias, back pain and joint swelling.  Skin: Positive for wound.       Laceration left upper eyelid  Neurological: Negative for dizziness, weakness, light-headedness, numbness and headaches.  Hematological: Does not bruise/bleed easily.  All other systems reviewed and are negative.    Physical Exam Updated Vital Signs BP 112/67   Pulse 86   Temp 97.7 F (36.5 C) (Oral)   Resp 16   Ht 5' (1.524 m)   Wt 63.5 kg (140 lb)   LMP 06/06/2017   SpO2 100%   BMI 27.34 kg/m   Physical Exam  Constitutional: She is oriented to person, place, and time. She appears well-developed and well-nourished. No distress.  HENT:  Head: Normocephalic and atraumatic.  Mouth/Throat:  Oropharynx is clear and moist.  Eyes: Conjunctivae and EOM are normal. Pupils are equal, round, and reactive to light.  Well-healing laceration to the skin of the left upper eyelid.  Mild edema noted.  Sutures are intact.  No surrounding erythema or drainage.  No periorbital tenderness  Neck: Normal range of motion.  Cardiovascular: Normal rate, regular rhythm and intact distal pulses.  No murmur heard. Pulmonary/Chest: Effort normal and breath sounds normal. No respiratory distress.  Musculoskeletal: She exhibits no edema or tenderness.  Neurological: She is alert and oriented to person, place, and time. She exhibits normal muscle tone. Coordination normal.  Skin: Skin is warm.  Psychiatric: She has a normal mood and affect.  Nursing note and vitals reviewed.    ED Treatments / Results  Labs (all  labs ordered are listed, but only abnormal results are displayed) Labs Reviewed - No data to display  EKG  EKG Interpretation None       Radiology No results found.  Procedures Procedures (including critical care time)  Medications Ordered in ED Medications - No data to display   Initial Impression / Assessment and Plan / ED Course  I have reviewed the triage vital signs and the nursing notes.  Pertinent labs & imaging results that were available during my care of the patient were reviewed by me and considered in my medical decision making (see chart for details).     Sutures removed by nursing staff without difficulty.  Patient tolerated procedure well.  No concerning symptoms for infection or dehiscence.  Wound care instructions discussed.  Final Clinical Impressions(s) / ED Diagnoses   Final diagnoses:  Visit for suture removal    ED Discharge Orders    None       Pauline Ausriplett, Johnny Latu, PA-C 06/11/17 1509    Loren RacerYelverton, David, MD 06/14/17 [id number]

## 2017-06-30 NOTE — L&D Delivery Note (Addendum)
OB/GYN Faculty Practice Delivery Note  Newton PiggSamantha M Schaefer is a 22 y.o. G2P0010 s/p SVD at 4846w6d. She was admitted for SOL.   ROM: 28h 7472m with  fluid GBS Status: negative Maximum Maternal Temperature: 102.77F @ 1259  Labor Progress: . Dx with chorioamnionitis, treated with ampicillin and gentamycin   Delivery Date/Time: 2325 Delivery: Called to room and patient was complete and pushing. Head delivered LOA. No nuchal cord present. Shoulder and body delivered in usual fashion. Infant with spontaneous cry, placed on mother's abdomen, dried and stimulated. Cord clamped x 2 after 1-minute delay, and cut by FOB. Cord blood drawn. Placenta delivered spontaneously with gentle cord traction. Fundus firm with massage and Pitocin. Labia, perineum, vagina, and cervix inspected inspected with 2nd degree laceration noted, repaired in the usual fashion with 3.0 Vicryl, hemostatic after repair.   Placenta: intact, sent for pathology due to chorioamnionitis  Complications: none Lacerations: 2nd degree laceration  EBL: 448ml  Infant: vigorous female  APGARs 8 and 9  weight pending   Burman NievesJulia Abraham, MD Family Medicine Resident    OB FELLOW DELIVERY ATTESTATION  I was gloved and present for the delivery in its entirety, and I agree with the above resident's note.    Gwenevere AbbotNimeka Jaquetta Currier, MD OB Fellow  05/11/2018, 1:33 AM

## 2017-09-28 ENCOUNTER — Encounter: Payer: Self-pay | Admitting: Women's Health

## 2017-09-28 ENCOUNTER — Ambulatory Visit: Payer: Medicaid Other | Admitting: Women's Health

## 2017-09-28 VITALS — BP 118/60 | HR 104 | Ht 60.0 in | Wt 122.5 lb

## 2017-09-28 DIAGNOSIS — R300 Dysuria: Secondary | ICD-10-CM | POA: Diagnosis not present

## 2017-09-28 DIAGNOSIS — Z3A01 Less than 8 weeks gestation of pregnancy: Secondary | ICD-10-CM

## 2017-09-28 DIAGNOSIS — R110 Nausea: Secondary | ICD-10-CM | POA: Diagnosis not present

## 2017-09-28 DIAGNOSIS — N926 Irregular menstruation, unspecified: Secondary | ICD-10-CM

## 2017-09-28 DIAGNOSIS — Z3201 Encounter for pregnancy test, result positive: Secondary | ICD-10-CM | POA: Diagnosis not present

## 2017-09-28 DIAGNOSIS — Z3491 Encounter for supervision of normal pregnancy, unspecified, first trimester: Secondary | ICD-10-CM

## 2017-09-28 LAB — POCT URINALYSIS DIPSTICK
Blood, UA: NEGATIVE
GLUCOSE UA: NEGATIVE
KETONES UA: NEGATIVE
Nitrite, UA: NEGATIVE
Protein, UA: NEGATIVE

## 2017-09-28 LAB — POCT URINE PREGNANCY: PREG TEST UR: POSITIVE — AB

## 2017-09-28 NOTE — Progress Notes (Signed)
   GYN VISIT Patient name: Carla Schaefer MRN [id number]  Date of birth: 09/28/1995 Chief Complaint:   Possible Pregnancy (burning with urination; stomach cramps)  History of Present Illness:   Carla Schaefer is a 22 y.o. 422P0010 Caucasian female at 5051w6d by uncertain LMP being seen today for +HPT. Reports burning w/ urination 'all the time', started again today, usually happens when she drinks juice. Some stomach cramps. Some heartburn. Some nausea. Problems sleeping. Pregnancy not planned, last depo 1.3985yr ago, thought it was still in her system and couldn't get pregnant. Is taking pnv.    Patient's last menstrual period was 08/19/2017 (approximate). The current method of family planning is none. Last pap 10/07/16. Results were:  neg Review of Systems:   Pertinent items are noted in HPI Denies fever/chills, dizziness, headaches, visual disturbances, fatigue, shortness of breath, chest pain, abdominal pain, vomiting, abnormal vaginal discharge/itching/odor/irritation, problems with periods, bowel movements, urination, or intercourse unless otherwise stated above.  Pertinent History Reviewed:  Reviewed past medical,surgical, social, obstetrical and family history.  Reviewed problem list, medications and allergies. Physical Assessment:   Vitals:   09/28/17 1420  BP: 118/60  Pulse: (!) 104  Weight: 122 lb 8 oz (55.6 kg)  Height: 5' (1.524 m)  Body mass index is 23.92 kg/m.       Physical Examination:   General appearance: alert, well appearing, and in no distress  Mental status: alert, oriented to person, place, and time  Skin: warm & dry   Cardiovascular: normal heart rate noted  Respiratory: normal respiratory effort, no distress  Abdomen: soft, non-tender   Pelvic: normal external genitalia, vulva, vagina, cervix, uterus and adnexa  Extremities: no edema   Results for orders placed or performed in visit on 09/28/17 (from the past 24 hour(s))  POCT urine pregnancy   Collection  Time: 09/28/17  2:27 PM  Result Value Ref Range   Preg Test, Ur Positive (A) Negative  POCT urinalysis dipstick   Collection Time: 09/28/17  2:29 PM  Result Value Ref Range   Color, UA     Clarity, UA     Glucose, UA neg    Bilirubin, UA     Ketones, UA neg    Spec Grav, UA  1.010 - 1.025   Blood, UA neg    pH, UA  5.0 - 8.0   Protein, UA neg    Urobilinogen, UA  0.2 or 1.0 E.U./dL   Nitrite, UA neg    Leukocytes, UA Small (1+) (A) Negative   Appearance     Odor      Assessment & Plan:  1) 4851w6d pregnant by uncertain LMP  2) Nausea> gave printed prevention/relief measures   3) Problems sleeping> gave printed prevention/relief measures   4) Frequent urination> send urine cx  Meds: No orders of the defined types were placed in this encounter.   Orders Placed This Encounter  Procedures  . Urine Culture  . US OB Comp Less 14 Wks  . POCT urine pregnancy  . POCT urinalysis dipstick    Return in about 2 weeks (around 10/12/2017) for dating u/s.  Cheral MarkerKimberly R Booker CNM, [facility]NP-BC 09/28/2017 3:27 PM

## 2017-09-28 NOTE — Patient Instructions (Signed)
Tips to Help You Sleep Better:   Get into a bedtime routine, try to do the same thing every night before going to bed to try to help your body wind down  Warm baths  Avoid caffeine for at least 3 hours before going to sleep   Keep your room at a slightly cooler temperature, can try running a fan  Turn off TV, lights, phone, electronics  Lots of pillows if needed to help you get comfortable  Lavender scented items can help you sleep. You can place lavender essential oil on a cotton ball and place under your pillowcase, or place in a diffuser. [employer] has a lavender scented sleep line (plug-ins, sprays, etc). Look in the pillow aisle for lavender scented pillows.    Nausea & Vomiting  Have saltine crackers or pretzels by your bed and eat a few bites before you raise your head out of bed in the morning  Eat small frequent meals throughout the day instead of large meals  Drink plenty of fluids throughout the day to stay hydrated, just don't drink a lot of fluids with your meals.  This can make your stomach fill up faster making you feel sick  Do not brush your teeth right after you eat  Products with real ginger are good for nausea, like ginger ale and ginger hard candy Make sure it says made with real ginger!  Sucking on sour candy like lemon heads is also good for nausea  If your prenatal vitamins make you nauseated, take them at night so you will sleep through the nausea  Sea Bands  If you feel like you need medicine for the nausea & vomiting please let us know  If you are unable to keep any fluids or food down please let us know

## 2017-09-30 LAB — URINE CULTURE: Organism ID, Bacteria: NO GROWTH

## 2017-10-12 ENCOUNTER — Ambulatory Visit (INDEPENDENT_AMBULATORY_CARE_PROVIDER_SITE_OTHER): Payer: Medicaid Other

## 2017-10-12 DIAGNOSIS — Z3A01 Less than 8 weeks gestation of pregnancy: Secondary | ICD-10-CM

## 2017-10-12 DIAGNOSIS — Z3481 Encounter for supervision of other normal pregnancy, first trimester: Secondary | ICD-10-CM

## 2017-10-12 DIAGNOSIS — Z3491 Encounter for supervision of normal pregnancy, unspecified, first trimester: Secondary | ICD-10-CM

## 2017-10-12 NOTE — Progress Notes (Signed)
US 7+5 wks,single IUP w/ ys,positive fht 167 bpm,normal ovaries bilat,crl 16.36 cm,EDD 05/26/2018 by LMP

## 2017-10-22 ENCOUNTER — Encounter: Payer: Self-pay | Admitting: Women's Health

## 2017-10-22 ENCOUNTER — Ambulatory Visit (INDEPENDENT_AMBULATORY_CARE_PROVIDER_SITE_OTHER): Payer: Medicaid Other | Admitting: Women's Health

## 2017-10-22 VITALS — BP 100/60 | HR 98 | Ht 60.0 in | Wt 124.0 lb

## 2017-10-22 DIAGNOSIS — H6691 Otitis media, unspecified, right ear: Secondary | ICD-10-CM | POA: Diagnosis not present

## 2017-10-22 DIAGNOSIS — Z1389 Encounter for screening for other disorder: Secondary | ICD-10-CM | POA: Diagnosis not present

## 2017-10-22 DIAGNOSIS — Z331 Pregnant state, incidental: Secondary | ICD-10-CM | POA: Diagnosis not present

## 2017-10-22 DIAGNOSIS — J069 Acute upper respiratory infection, unspecified: Secondary | ICD-10-CM

## 2017-10-22 LAB — POCT URINALYSIS DIPSTICK
Blood, UA: NEGATIVE
GLUCOSE UA: NEGATIVE
Ketones, UA: NEGATIVE
Leukocytes, UA: NEGATIVE
Nitrite, UA: NEGATIVE
Protein, UA: NEGATIVE

## 2017-10-22 MED ORDER — AMOXICILLIN-POT CLAVULANATE 875-125 MG PO TABS: 1.0000 | ORAL_TABLET | Freq: Two times a day (BID) | ORAL | 0 refills | Status: DC

## 2017-10-22 NOTE — Progress Notes (Signed)
   GYN VISIT Patient name: Carla Schaefer MRN [id number]  Date of birth: 11/27/1995 Chief Complaint:   rt ear hurt  History of Present Illness:   Carla Schaefer is a 22 y.o. 552P0010 Caucasian female at 2262w1d being seen today for report of Rt ear pain x 1wk. Using sweet oil w/o relief. Some congestion, itchy throat, chills, headache. Has new ob appt scheduled for Monday.      Patient's last menstrual period was 08/19/2017 (approximate). The current method of family planning is none. Review of Systems:   Pertinent items are noted in HPI Denies fever/chills, dizziness, headaches, visual disturbances, fatigue, shortness of breath, chest pain, abdominal pain, vomiting, abnormal vaginal discharge/itching/odor/irritation, problems with periods, bowel movements, urination, or intercourse unless otherwise stated above.  Pertinent History Reviewed:  Reviewed past medical,surgical, social, obstetrical and family history.  Reviewed problem list, medications and allergies. Physical Assessment:   Vitals:   10/22/17 1206  BP: 100/60  Pulse: 98  Weight: 124 lb (56.2 kg)  Height: 5' (1.524 m)  Body mass index is 24.22 kg/m.       Physical Examination: by Tonia GhentKatie Woods, SNP and myself  General appearance: alert, well appearing, and in no distress  Mental status: alert, oriented to person, place, and time  Skin: warm & dry   Sinuses: + tenderness to palpation Lt frontal sinus  Ears - unable to see either TM d/t excessive cerumen, Rt ear tender  Mouth - erythematous and no exudate  Neck - mild lymphadenopathy   Cardiovascular: normal heart rate noted  Respiratory: normal respiratory effort, no distress  Abdomen: soft, non-tender   Pelvic: examination not indicated  Extremities: no edema   Results for orders placed or performed in visit on 10/22/17 (from the past 24 hour(s))  POCT urinalysis dipstick   Collection Time: 10/22/17 12:20 PM  Result Value Ref Range   Color, UA     Clarity, UA       Glucose, UA neg    Bilirubin, UA     Ketones, UA neg    Spec Grav, UA  1.010 - 1.025   Blood, UA neg    pH, UA  5.0 - 8.0   Protein, UA neg    Urobilinogen, UA  0.2 or 1.0 E.U./dL   Nitrite, UA neg    Leukocytes, UA Negative Negative   Appearance     Odor      Assessment & Plan:  1) 6462w1d pregnant  2) Rt otitis media (based on sx) w/ URI> rx augmentin bid x 7d, get debrox and clean out wax  Meds:  Meds ordered this encounter  Medications  . amoxicillin-clavulanate (AUGMENTIN) 875-125 MG tablet    Sig: Take 1 tablet by mouth 2 (two) times daily. X 7days    Dispense:  14 tablet    Refill:  0    Orders Placed This Encounter  Procedures  . POCT urinalysis dipstick    Return for As scheduled for New OB.  Cheral MarkerKimberly R Rhyland Hinderliter CNM, [facility]NP[facility]BC 10/22/2017 12:37 PM

## 2017-10-22 NOTE — Patient Instructions (Signed)
Debrox

## 2017-10-26 ENCOUNTER — Encounter: Payer: Self-pay | Admitting: Women's Health

## 2017-10-26 ENCOUNTER — Telehealth: Payer: Self-pay | Admitting: *Deleted

## 2017-10-26 ENCOUNTER — Ambulatory Visit (INDEPENDENT_AMBULATORY_CARE_PROVIDER_SITE_OTHER): Payer: Medicaid Other | Admitting: Women's Health

## 2017-10-26 ENCOUNTER — Ambulatory Visit: Payer: Medicaid Other | Admitting: *Deleted

## 2017-10-26 VITALS — BP 102/60 | HR 68 | Wt 127.0 lb

## 2017-10-26 DIAGNOSIS — R110 Nausea: Secondary | ICD-10-CM

## 2017-10-26 DIAGNOSIS — F418 Other specified anxiety disorders: Secondary | ICD-10-CM | POA: Diagnosis not present

## 2017-10-26 DIAGNOSIS — Z331 Pregnant state, incidental: Secondary | ICD-10-CM

## 2017-10-26 DIAGNOSIS — O99321 Drug use complicating pregnancy, first trimester: Secondary | ICD-10-CM

## 2017-10-26 DIAGNOSIS — Z3A09 9 weeks gestation of pregnancy: Secondary | ICD-10-CM | POA: Diagnosis not present

## 2017-10-26 DIAGNOSIS — O99331 Smoking (tobacco) complicating pregnancy, first trimester: Secondary | ICD-10-CM

## 2017-10-26 DIAGNOSIS — O0990 Supervision of high risk pregnancy, unspecified, unspecified trimester: Secondary | ICD-10-CM | POA: Insufficient documentation

## 2017-10-26 DIAGNOSIS — Z3481 Encounter for supervision of other normal pregnancy, first trimester: Secondary | ICD-10-CM

## 2017-10-26 DIAGNOSIS — Z3682 Encounter for antenatal screening for nuchal translucency: Secondary | ICD-10-CM

## 2017-10-26 DIAGNOSIS — O99341 Other mental disorders complicating pregnancy, first trimester: Secondary | ICD-10-CM | POA: Diagnosis not present

## 2017-10-26 DIAGNOSIS — F17200 Nicotine dependence, unspecified, uncomplicated: Secondary | ICD-10-CM | POA: Insufficient documentation

## 2017-10-26 DIAGNOSIS — F1290 Cannabis use, unspecified, uncomplicated: Secondary | ICD-10-CM | POA: Diagnosis not present

## 2017-10-26 DIAGNOSIS — O9989 Other specified diseases and conditions complicating pregnancy, childbirth and the puerperium: Secondary | ICD-10-CM

## 2017-10-26 DIAGNOSIS — F17210 Nicotine dependence, cigarettes, uncomplicated: Secondary | ICD-10-CM | POA: Diagnosis not present

## 2017-10-26 DIAGNOSIS — Z1389 Encounter for screening for other disorder: Secondary | ICD-10-CM

## 2017-10-26 LAB — POCT URINALYSIS DIPSTICK
GLUCOSE UA: NEGATIVE
Ketones, UA: NEGATIVE
Leukocytes, UA: NEGATIVE
Nitrite, UA: NEGATIVE
Protein, UA: NEGATIVE
RBC UA: NEGATIVE

## 2017-10-26 MED ORDER — DOXYLAMINE-PYRIDOXINE 10-10 MG PO TBEC: DELAYED_RELEASE_TABLET | ORAL | 6 refills | Status: DC

## 2017-10-26 NOTE — Telephone Encounter (Signed)
Pharmacy made aware PA done and approved Diclegis.

## 2017-10-26 NOTE — Progress Notes (Signed)
INITIAL OBSTETRICAL VISIT Patient name: Carla Schaefer MRN [id number]  Date of birth: 1996/05/21 Chief Complaint:   Initial Prenatal Visit (nausea, cramping)  History of Present Illness:   Carla Schaefer is a 22 y.o. G16P0010 Caucasian female at [redacted]w[redacted]d by LMP c/w 8wk u/s, with an Estimated Date of Delivery: 05/26/18 being seen today for her initial obstetrical visit.   Her obstetrical history is significant for EAB x 1.   Today she reports earache better. Some nausea- requests meds. Some cramping. Has always been painful to wear jeans, even prior to pregnancy, not sure why.  H/O dep/anx- no meds, feels better since pregnant Smoker: 2ppd prior to pregnancy, now down to 1/2ppd and trying to quit completely.  THC use- at night to help w/ sleeping Patient's last menstrual period was 08/19/2017 (approximate). Last pap 10/07/16. Results were: normal Review of Systems:   Pertinent items are noted in HPI Denies cramping/contractions, leakage of fluid, vaginal bleeding, abnormal vaginal discharge w/ itching/odor/irritation, headaches, visual changes, shortness of breath, chest pain, abdominal pain, severe nausea/vomiting, or problems with urination or bowel movements unless otherwise stated above.  Pertinent History Reviewed:  Reviewed past medical,surgical, social, obstetrical and family history.  Reviewed problem list, medications and allergies. OB History  Gravida Para Term Preterm AB Living  2       1    SAB TAB Ectopic Multiple Live Births  0 1          # Outcome Date GA Lbr Len/2nd Weight Sex Delivery Anes PTL Lv  2 Current           1 TAB 2010           Physical Assessment:   Vitals:   10/26/17 1026  BP: 102/60  Pulse: 68  Weight: 127 lb (57.6 kg)  Body mass index is 24.8 kg/m.       Physical Examination:  General appearance - well appearing, and in no distress  Mental status - alert, oriented to person, place, and time  Psych:  She has a normal mood and affect  Skin -  warm and dry, normal color, no suspicious lesions noted  Chest - effort normal, all lung fields clear to auscultation bilaterally  Heart - normal rate and regular rhythm  Abdomen - soft, nontender  Extremities:  No swelling or varicosities noted  Thin prep pap is not done  Fetal Heart Rate (bpm): +u/s via informal transabdominal u/s  Results for orders placed or performed in visit on 10/26/17 (from the past 24 hour(s))  POCT urinalysis dipstick   Collection Time: 10/26/17 10:47 AM  Result Value Ref Range   Color, UA     Clarity, UA     Glucose, UA neg    Bilirubin, UA     Ketones, UA neg    Spec Grav, UA  1.010 - 1.025   Blood, UA neg    pH, UA  5.0 - 8.0   Protein, UA neg    Urobilinogen, UA  0.2 or 1.0 E.U./dL   Nitrite, UA neg    Leukocytes, UA Negative Negative   Appearance     Odor      Assessment & Plan:  1) Low-Risk Pregnancy G2P0010 at [redacted]w[redacted]d with an Estimated Date of Delivery: 05/26/18   2) Initial OB visit  3) Smoker> 1/2pp/day (down from 2ppd), counseled x 3-47mins, advised cessation, discussed risks to fetus while pregnant, to infant pp, and to herself. Offered QuitlineNC, declined  4) Nausea> rx Diclegis  5) Dep/anx- no meds currently, doing well  6) THC use> advised complete cessation, discussed potential long-term effects to baby       Meds:  Meds ordered this encounter  Medications  . Doxylamine-Pyridoxine (DICLEGIS) 10-10 MG TBEC    Sig: 2 tabs q hs, if sx persist add 1 tab q am on day 3, if sx persist add 1 tab q afternoon on day 4    Dispense:  100 tablet    Refill:  6    Order Specific Question:   Supervising Provider    Answer:   Lazaro Arms [[id number]]    Initial labs obtained Continue prenatal vitamins Reviewed n/v relief measures and warning s/s to report Reviewed recommended weight gain based on pre-gravid BMI Encouraged well-balanced diet Genetic Screening discussed Integrated Screen: requested Cystic fibrosis screening discussed  declined Ultrasound discussed; fetal survey: requested CCNC completed> spoke w/ PCM  Follow-up: Return in about 3 weeks (around 11/18/2017) for LROB, US:NT+1stIT.   Orders Placed This Encounter  Procedures  . GC/Chlamydia Probe Amp  . Urine Culture  . US Fetal Nuchal Translucency Measurement  . Obstetric Panel, Including HIV  . Urinalysis, Routine w reflex microscopic  . Pain Management Screening Profile (10S)  . POCT urinalysis dipstick    Cheral Marker CNM, [facility] 10/26/2017 1:00 PM

## 2017-10-26 NOTE — Patient Instructions (Signed)
Carla Schaefer, I greatly value your feedback.  If you receive a survey following your visit with [place] today, we appreciate you taking the time to fill it out.  Thanks, Joellyn Haff, CNM, WHNP-BC   Nausea & Vomiting  Have saltine crackers or pretzels by your bed and eat a few bites before you raise your head out of bed in the morning  Eat small frequent meals throughout the day instead of large meals  Drink plenty of fluids throughout the day to stay hydrated, just don't drink a lot of fluids with your meals.  This can make your stomach fill up faster making you feel sick  Do not brush your teeth right after you eat  Products with real ginger are good for nausea, like ginger ale and ginger hard candy Make sure it says made with real ginger!  Sucking on sour candy like lemon heads is also good for nausea  If your prenatal vitamins make you nauseated, take them at night so you will sleep through the nausea  Sea Bands  If you feel like you need medicine for the nausea & vomiting please let us know  If you are unable to keep any fluids or food down please let us know   Constipation  Drink plenty of fluid, preferably water, throughout the day  Eat foods high in fiber such as fruits, vegetables, and grains  Exercise, such as walking, is a good way to keep your bowels regular  Drink warm fluids, especially warm prune juice, or decaf coffee  Eat a 1/2 cup of real oatmeal (not instant), 1/2 cup applesauce, and 1/2-1 cup warm prune juice every day  If needed, you may take Colace (docusate sodium) stool softener once or twice a day to help keep the stool soft. If you are pregnant, wait until you are out of your first trimester (12-14 weeks of pregnancy)  If you still are having problems with constipation, you may take Miralax once daily as needed to help keep your bowels regular.  If you are pregnant, wait until you are out of your first trimester (12-14 weeks of pregnancy)   First  Trimester of Pregnancy The first trimester of pregnancy is from week 1 until the end of week 12 (months 1 through 3). A week after a sperm fertilizes an egg, the egg will implant on the wall of the uterus. This embryo will begin to develop into a baby. Genes from you and your partner are forming the baby. The female genes determine whether the baby is a boy or a girl. At 6-8 weeks, the eyes and face are formed, and the heartbeat can be seen on ultrasound. At the end of 12 weeks, all the baby's organs are formed.  Now that you are pregnant, you will want to do everything you can to have a healthy baby. Two of the most important things are to get good prenatal care and to follow your health care provider's instructions. Prenatal care is all the medical care you receive before the baby's birth. This care will help prevent, find, and treat any problems during the pregnancy and childbirth. BODY CHANGES Your body goes through many changes during pregnancy. The changes vary from woman to woman.   You may gain or lose a couple of pounds at first.  You may feel sick to your stomach (nauseous) and throw up (vomit). If the vomiting is uncontrollable, call your health care provider.  You may tire easily.  You may develop headaches  that can be relieved by medicines approved by your health care provider.  You may urinate more often. Painful urination may mean you have a bladder infection.  You may develop heartburn as a result of your pregnancy.  You may develop constipation because certain hormones are causing the muscles that push waste through your intestines to slow down.  You may develop hemorrhoids or swollen, bulging veins (varicose veins).  Your breasts may begin to grow larger and become tender. Your nipples may stick out more, and the tissue that surrounds them (areola) may become darker.  Your gums may bleed and may be sensitive to brushing and flossing.  Dark spots or blotches (chloasma, mask  of pregnancy) may develop on your face. This will likely fade after the baby is born.  Your menstrual periods will stop.  You may have a loss of appetite.  You may develop cravings for certain kinds of food.  You may have changes in your emotions from day to day, such as being excited to be pregnant or being concerned that something may go wrong with the pregnancy and baby.  You may have more vivid and strange dreams.  You may have changes in your hair. These can include thickening of your hair, rapid growth, and changes in texture. Some women also have hair loss during or after pregnancy, or hair that feels dry or thin. Your hair will most likely return to normal after your baby is born. WHAT TO EXPECT AT YOUR PRENATAL VISITS During a routine prenatal visit:  You will be weighed to make sure you and the baby are growing normally.  Your blood pressure will be taken.  Your abdomen will be measured to track your baby's growth.  The fetal heartbeat will be listened to starting around week 10 or 12 of your pregnancy.  Test results from any previous visits will be discussed. Your health care provider may ask you:  How you are feeling.  If you are feeling the baby move.  If you have had any abnormal symptoms, such as leaking fluid, bleeding, severe headaches, or abdominal cramping.  If you have any questions. Other tests that may be performed during your first trimester include:  Blood tests to find your blood type and to check for the presence of any previous infections. They will also be used to check for low iron levels (anemia) and Rh antibodies. Later in the pregnancy, blood tests for diabetes will be done along with other tests if problems develop.  Urine tests to check for infections, diabetes, or protein in the urine.  An ultrasound to confirm the proper growth and development of the baby.  An amniocentesis to check for possible genetic problems.  Fetal screens for spina  bifida and Down syndrome.  You may need other tests to make sure you and the baby are doing well. HOME CARE INSTRUCTIONS  Medicines  Follow your health care provider's instructions regarding medicine use. Specific medicines may be either safe or unsafe to take during pregnancy.  Take your prenatal vitamins as directed.  If you develop constipation, try taking a stool softener if your health care provider approves. Diet  Eat regular, well-balanced meals. Choose a variety of foods, such as meat or vegetable-based protein, fish, milk and low-fat dairy products, vegetables, fruits, and whole grain breads and cereals. Your health care provider will help you determine the amount of weight gain that is right for you.  Avoid raw meat and uncooked cheese. These carry germs that can  cause birth defects in the baby.  Eating four or five small meals rather than three large meals a day may help relieve nausea and vomiting. If you start to feel nauseous, eating a few soda crackers can be helpful. Drinking liquids between meals instead of during meals also seems to help nausea and vomiting.  If you develop constipation, eat more high-fiber foods, such as fresh vegetables or fruit and whole grains. Drink enough fluids to keep your urine clear or pale yellow. Activity and Exercise  Exercise only as directed by your health care provider. Exercising will help you:  Control your weight.  Stay in shape.  Be prepared for labor and delivery.  Experiencing pain or cramping in the lower abdomen or low back is a good sign that you should stop exercising. Check with your health care provider before continuing normal exercises.  Try to avoid standing for long periods of time. Move your legs often if you must stand in one place for a long time.  Avoid heavy lifting.  Wear low-heeled shoes, and practice good posture.  You may continue to have sex unless your health care provider directs you  otherwise. Relief of Pain or Discomfort  Wear a good support bra for breast tenderness.   Take warm sitz baths to soothe any pain or discomfort caused by hemorrhoids. Use hemorrhoid cream if your health care provider approves.   Rest with your legs elevated if you have leg cramps or low back pain.  If you develop varicose veins in your legs, wear support hose. Elevate your feet for 15 minutes, 3-4 times a day. Limit salt in your diet. Prenatal Care  Schedule your prenatal visits by the twelfth week of pregnancy. They are usually scheduled monthly at first, then more often in the last 2 months before delivery.  Write down your questions. Take them to your prenatal visits.  Keep all your prenatal visits as directed by your health care provider. Safety  Wear your seat belt at all times when driving.  Make a list of emergency phone numbers, including numbers for family, friends, the hospital, and police and fire departments. General Tips  Ask your health care provider for a referral to a local prenatal education class. Begin classes no later than at the beginning of month 6 of your pregnancy.  Ask for help if you have counseling or nutritional needs during pregnancy. Your health care provider can offer advice or refer you to specialists for help with various needs.  Do not use hot tubs, steam rooms, or saunas.  Do not douche or use tampons or scented sanitary pads.  Do not cross your legs for long periods of time.  Avoid cat litter boxes and soil used by cats. These carry germs that can cause birth defects in the baby and possibly loss of the fetus by miscarriage or stillbirth.  Avoid all smoking, herbs, alcohol, and medicines not prescribed by your health care provider. Chemicals in these affect the formation and growth of the baby.  Schedule a dentist appointment. At home, brush your teeth with a soft toothbrush and be gentle when you floss. SEEK MEDICAL CARE IF:   You have  dizziness.  You have mild pelvic cramps, pelvic pressure, or nagging pain in the abdominal area.  You have persistent nausea, vomiting, or diarrhea.  You have a bad smelling vaginal discharge.  You have pain with urination.  You notice increased swelling in your face, hands, legs, or ankles. SEEK IMMEDIATE MEDICAL CARE IF:  You have a fever.  You are leaking fluid from your vagina.  You have spotting or bleeding from your vagina.  You have severe abdominal cramping or pain.  You have rapid weight gain or loss.  You vomit blood or material that looks like coffee grounds.  You are exposed to [place] measles and have never had them.  You are exposed to fifth disease or chickenpox.  You develop a severe headache.  You have shortness of breath.  You have any kind of trauma, such as from a fall or a car accident. Document Released: 06/10/2001 Document Revised: 10/31/2013 Document Reviewed: 04/26/2013 [facility] Patient Information 2015 [place], [place]. This information is not intended to replace advice given to you by your health care provider. Make sure you discuss any questions you have with your health care provider.

## 2017-10-27 LAB — URINALYSIS, ROUTINE W REFLEX MICROSCOPIC
Bilirubin, UA: NEGATIVE
GLUCOSE, UA: NEGATIVE
Ketones, UA: NEGATIVE
LEUKOCYTES UA: NEGATIVE
Nitrite, UA: NEGATIVE
PH UA: 8.5 — AB (ref 5.0–7.5)
PROTEIN UA: NEGATIVE
RBC, UA: NEGATIVE
Specific Gravity, UA: 1.014 (ref 1.005–1.030)
UUROB: 0.2 mg/dL (ref 0.2–1.0)

## 2017-10-27 LAB — OBSTETRIC PANEL, INCLUDING HIV
Antibody Screen: NEGATIVE
BASOS ABS: 0.1 10*3/uL (ref 0.0–0.2)
Basos: 1 %
EOS (ABSOLUTE): 0.3 10*3/uL (ref 0.0–0.4)
Eos: 3 %
HIV SCREEN 4TH GENERATION: NONREACTIVE
Hematocrit: 40.4 % (ref 34.0–46.6)
Hemoglobin: 13.6 g/dL (ref 11.1–15.9)
Hepatitis B Surface Ag: NEGATIVE
IMMATURE GRANULOCYTES: 0 %
Immature Grans (Abs): 0 10*3/uL (ref 0.0–0.1)
LYMPHS ABS: 2.8 10*3/uL (ref 0.7–3.1)
Lymphs: 26 %
MCH: 30.7 pg (ref 26.6–33.0)
MCHC: 33.7 g/dL (ref 31.5–35.7)
MCV: 91 fL (ref 79–97)
MONOS ABS: 0.8 10*3/uL (ref 0.1–0.9)
Monocytes: 8 %
NEUTROS ABS: 6.7 10*3/uL (ref 1.4–7.0)
NEUTROS PCT: 62 %
PLATELETS: 294 10*3/uL (ref 150–379)
RBC: 4.43 x10E6/uL (ref 3.77–5.28)
RDW: 13.1 % (ref 12.3–15.4)
RH TYPE: POSITIVE
RPR: NONREACTIVE
Rubella Antibodies, IGG: 1.34 index (ref >0.99)
WBC: 10.6 10*3/uL (ref 3.4–10.8)

## 2017-10-27 LAB — PMP SCREEN PROFILE (10S), URINE
Amphetamine Scrn, Ur: NEGATIVE ng/mL
BARBITURATE SCREEN URINE: NEGATIVE ng/mL
BENZODIAZEPINE SCREEN, URINE: NEGATIVE ng/mL
CANNABINOIDS UR QL SCN: POSITIVE ng/mL — AB
COCAINE(METAB.)SCREEN, URINE: NEGATIVE ng/mL
Creatinine(Crt), U: 47.3 mg/dL (ref 20.0–300.0)
Methadone Screen, Urine: NEGATIVE ng/mL
OXYCODONE+OXYMORPHONE UR QL SCN: NEGATIVE ng/mL
Opiate Scrn, Ur: NEGATIVE ng/mL
PH UR, DRUG SCRN: 8.7 (ref 4.5–8.9)
Phencyclidine Qn, Ur: NEGATIVE ng/mL
Propoxyphene Scrn, Ur: NEGATIVE ng/mL

## 2017-10-28 LAB — URINE CULTURE

## 2017-10-28 LAB — GC/CHLAMYDIA PROBE AMP
CHLAMYDIA, DNA PROBE: NEGATIVE
NEISSERIA GONORRHOEAE BY PCR: NEGATIVE

## 2017-11-09 ENCOUNTER — Telehealth: Payer: Self-pay | Admitting: Obstetrics & Gynecology

## 2017-11-09 MED ORDER — PROMETHAZINE HCL 25 MG PO TABS: 12.5000 mg | ORAL_TABLET | Freq: Four times a day (QID) | ORAL | 0 refills | Status: DC | PRN

## 2017-11-09 NOTE — Telephone Encounter (Signed)
Spoke with pt letting her know Phenergan was sent to pharmacy. JSY

## 2017-11-16 ENCOUNTER — Encounter: Payer: Self-pay | Admitting: Obstetrics and Gynecology

## 2017-11-16 ENCOUNTER — Ambulatory Visit (INDEPENDENT_AMBULATORY_CARE_PROVIDER_SITE_OTHER): Payer: Medicaid Other

## 2017-11-16 ENCOUNTER — Ambulatory Visit (INDEPENDENT_AMBULATORY_CARE_PROVIDER_SITE_OTHER): Payer: Medicaid Other | Admitting: Obstetrics and Gynecology

## 2017-11-16 VITALS — BP 100/60 | HR 91 | Wt 125.0 lb

## 2017-11-16 DIAGNOSIS — Z3481 Encounter for supervision of other normal pregnancy, first trimester: Secondary | ICD-10-CM | POA: Diagnosis not present

## 2017-11-16 DIAGNOSIS — Z3682 Encounter for antenatal screening for nuchal translucency: Secondary | ICD-10-CM

## 2017-11-16 DIAGNOSIS — Z331 Pregnant state, incidental: Secondary | ICD-10-CM

## 2017-11-16 DIAGNOSIS — Z3A12 12 weeks gestation of pregnancy: Secondary | ICD-10-CM

## 2017-11-16 DIAGNOSIS — Z1389 Encounter for screening for other disorder: Secondary | ICD-10-CM

## 2017-11-16 LAB — POCT URINALYSIS DIPSTICK
Blood, UA: NEGATIVE
Glucose, UA: NEGATIVE
Ketones, UA: NEGATIVE
LEUKOCYTES UA: NEGATIVE
NITRITE UA: NEGATIVE
Protein, UA: NEGATIVE

## 2017-11-16 NOTE — Patient Instructions (Addendum)
(  [phone] is the phone number for Pregnancy Classes or hospital tours at [facility].  ° °You will be referred to  [email or web address]   for more information on childbirth classes   °At this site you may register for classes. You may sign up for a waiting list if classes are full. Please SIGN UP FOR THIS!.   When the waiting list becomes long, sometimes new classes can be added. ° ° ° °

## 2017-11-16 NOTE — Progress Notes (Signed)
  Patient ID: Carla Schaefer, female   DOB: 10/04/1995, 22 y.o.   MRN: [id number]   LOW-RISK PREGNANCY VISIT Patient name: Carla Schaefer MRN [id number]  Date of birth: June 08, 1996 Chief Complaint:   Routine Prenatal Visit (1st IT; anxiety getting worse; nausea med not helping)  History of Present Illness:   Carla Schaefer is a 22 y.o. G77P0010 female at [redacted]w[redacted]d with an Estimated Date of Delivery: 05/26/18 being seen today for ongoing management of a low-risk pregnancy.  Today she reports discomfort while sleeping.  . Vag. Bleeding: None.   . denies leaking of fluid. Review of Systems:   Pertinent items are noted in HPI Denies abnormal vaginal discharge w/ itching/odor/irritation, headaches, visual changes, shortness of breath, chest pain, abdominal pain, severe nausea/vomiting, or problems with urination or bowel movements unless otherwise stated above. Pertinent History Reviewed:  Reviewed past medical,surgical, social, obstetrical and family history.  Reviewed problem list, medications and allergies. Physical Assessment:   Vitals:   11/16/17 1339  BP: 100/60  Pulse: 91  Weight: 125 lb (56.7 kg)  Body mass index is 24.41 kg/m.        Physical Examination:   General appearance: Well appearing, and in no distress  Mental status: Alert, oriented to person, place, and time  Skin: Warm & dry  Cardiovascular: Normal heart rate noted  Respiratory: Normal respiratory effort, no distress  Abdomen: Soft, gravid, nontender  Pelvic: Cervical exam deferred         Extremities: Edema: None  Fetal Status: Fetal Heart Rate (bpm): 160 u/s        Results for orders placed or performed in visit on 11/16/17 (from the past 24 hour(s))  POCT urinalysis dipstick   Collection Time: 11/16/17  1:41 PM  Result Value Ref Range   Color, UA     Clarity, UA     Glucose, UA Negative Negative   Bilirubin, UA     Ketones, UA neg    Spec Grav, UA  1.010 - 1.025   Blood, UA neg    pH, UA  5.0 - 8.0   Protein, UA Negative Negative   Urobilinogen, UA  0.2 or 1.0 E.U./dL   Nitrite, UA neg    Leukocytes, UA Negative Negative   Appearance     Odor      Assessment & Plan:  1) Low-risk pregnancy G2P0010 at [redacted]w[redacted]d with an Estimated Date of Delivery: 05/26/18  DESIRES nt/IT TESTING, ORDERED  2) Discussed importance of child birthing classes.   Meds: No orders of the defined types were placed in this encounter.  Labs/procedures today: [place] 12+5 wks,measurements c/w dates,normal ovaries bilat,anterior pl gr 0,NB present,NT 1.7 mm,crl 1.7 mm,fhr 160 bpm  Plan:  Continue routine obstetrical  Rx Zofran    Follow-up: Return in about 1 month (around 12/14/2017) for LROB.  Orders Placed This Encounter  Procedures  . Integrated 1  . POCT urinalysis dipstick   By signing my name below, I, Carla Schaefer, attest that this documentation has been prepared under the direction and in the presence of Carla Burrow, MD. Electronically Signed: Diona Schaefer, Medical Scribe. 11/16/17. 1:56 PM.  I personally performed the services described in this documentation, which was SCRIBED in my presence. The recorded information has been reviewed and considered accurate. It has been edited as necessary during review. Carla Burrow, MD

## 2017-11-16 NOTE — Progress Notes (Signed)
[place] 12+5 wks,measurements c/w dates,normal ovaries bilat,anterior pl gr 0,NB present,NT 1.7 mm,crl 1.7 mm,fhr 160 bpm

## 2017-11-18 LAB — INTEGRATED 1
Crown Rump Length: 75.9 mm
Gest. Age on Collection Date: 13.4 weeks
Maternal Age at EDD: 24.1 yr
NUCHAL TRANSLUCENCY (NT): 1.7 mm
NUMBER OF FETUSES: 1
PAPP-A VALUE: 955.8 ng/mL
WEIGHT: 125 [lb_av]

## 2017-12-14 ENCOUNTER — Encounter: Payer: Self-pay | Admitting: Obstetrics & Gynecology

## 2017-12-14 ENCOUNTER — Ambulatory Visit (INDEPENDENT_AMBULATORY_CARE_PROVIDER_SITE_OTHER): Payer: Medicaid Other | Admitting: Women's Health

## 2017-12-14 ENCOUNTER — Other Ambulatory Visit: Payer: Self-pay

## 2017-12-14 VITALS — BP 110/64 | HR 89 | Wt 130.0 lb

## 2017-12-14 DIAGNOSIS — O26892 Other specified pregnancy related conditions, second trimester: Secondary | ICD-10-CM

## 2017-12-14 DIAGNOSIS — Z331 Pregnant state, incidental: Secondary | ICD-10-CM

## 2017-12-14 DIAGNOSIS — N760 Acute vaginitis: Secondary | ICD-10-CM

## 2017-12-14 DIAGNOSIS — Z3A16 16 weeks gestation of pregnancy: Secondary | ICD-10-CM

## 2017-12-14 DIAGNOSIS — Z3482 Encounter for supervision of other normal pregnancy, second trimester: Secondary | ICD-10-CM

## 2017-12-14 DIAGNOSIS — O99342 Other mental disorders complicating pregnancy, second trimester: Secondary | ICD-10-CM

## 2017-12-14 DIAGNOSIS — B9689 Other specified bacterial agents as the cause of diseases classified elsewhere: Secondary | ICD-10-CM

## 2017-12-14 DIAGNOSIS — O23592 Infection of other part of genital tract in pregnancy, second trimester: Secondary | ICD-10-CM | POA: Diagnosis not present

## 2017-12-14 DIAGNOSIS — Z363 Encounter for antenatal screening for malformations: Secondary | ICD-10-CM

## 2017-12-14 DIAGNOSIS — F418 Other specified anxiety disorders: Secondary | ICD-10-CM

## 2017-12-14 DIAGNOSIS — Z1389 Encounter for screening for other disorder: Secondary | ICD-10-CM

## 2017-12-14 DIAGNOSIS — N898 Other specified noninflammatory disorders of vagina: Secondary | ICD-10-CM

## 2017-12-14 DIAGNOSIS — Z1379 Encounter for other screening for genetic and chromosomal anomalies: Secondary | ICD-10-CM

## 2017-12-14 LAB — POCT URINALYSIS DIPSTICK
Blood, UA: NEGATIVE
Glucose, UA: NEGATIVE
Ketones, UA: NEGATIVE
NITRITE UA: NEGATIVE
PROTEIN UA: NEGATIVE

## 2017-12-14 LAB — POCT WET PREP (WET MOUNT)
Clue Cells Wet Prep Whiff POC: POSITIVE
Trichomonas Wet Prep HPF POC: ABSENT

## 2017-12-14 MED ORDER — METRONIDAZOLE 500 MG PO TABS: 500.0000 mg | ORAL_TABLET | Freq: Two times a day (BID) | ORAL | 0 refills | Status: DC

## 2017-12-14 MED ORDER — SERTRALINE HCL 25 MG PO TABS: 25.0000 mg | ORAL_TABLET | Freq: Every day | ORAL | 6 refills | Status: DC

## 2017-12-14 NOTE — Patient Instructions (Addendum)
Carla Schaefer, I greatly value your feedback.  If you receive a survey following your visit with [place] today, we appreciate you taking the time to fill it out.  Thanks, Joellyn Haff, CNM, WHNP-BC   Second Trimester of Pregnancy The second trimester is from week 14 through week 27 (months 4 through 6). The second trimester is often a time when you feel your best. Your body has adjusted to being pregnant, and you begin to feel better physically. Usually, morning sickness has lessened or quit completely, you may have more energy, and you may have an increase in appetite. The second trimester is also a time when the fetus is growing rapidly. At the end of the sixth month, the fetus is about 9 inches long and weighs about 1 pounds. You will likely begin to feel the baby move (quickening) between 16 and 20 weeks of pregnancy. Body changes during your second trimester Your body continues to go through many changes during your second trimester. The changes vary from woman to woman.  Your weight will continue to increase. You will notice your lower abdomen bulging out.  You may begin to get stretch marks on your hips, abdomen, and breasts.  You may develop headaches that can be relieved by medicines. The medicines should be approved by your health care provider.  You may urinate more often because the fetus is pressing on your bladder.  You may develop or continue to have heartburn as a result of your pregnancy.  You may develop constipation because certain hormones are causing the muscles that push waste through your intestines to slow down.  You may develop hemorrhoids or swollen, bulging veins (varicose veins).  You may have back pain. This is caused by: ? Weight gain. ? Pregnancy hormones that are relaxing the joints in your pelvis. ? A shift in weight and the muscles that support your balance.  Your breasts will continue to grow and they will continue to become tender.  Your gums may bleed  and may be sensitive to brushing and flossing.  Dark spots or blotches (chloasma, mask of pregnancy) may develop on your face. This will likely fade after the baby is born.  A dark line from your belly button to the pubic area (linea nigra) may appear. This will likely fade after the baby is born.  You may have changes in your hair. These can include thickening of your hair, rapid growth, and changes in texture. Some women also have hair loss during or after pregnancy, or hair that feels dry or thin. Your hair will most likely return to normal after your baby is born.  What to expect at prenatal visits During a routine prenatal visit:  You will be weighed to make sure you and the fetus are growing normally.  Your blood pressure will be taken.  Your abdomen will be measured to track your baby's growth.  The fetal heartbeat will be listened to.  Any test results from the previous visit will be discussed.  Your health care provider may ask you:  How you are feeling.  If you are feeling the baby move.  If you have had any abnormal symptoms, such as leaking fluid, bleeding, severe headaches, or abdominal cramping.  If you are using any tobacco products, including cigarettes, chewing tobacco, and electronic cigarettes.  If you have any questions.  Other tests that may be performed during your second trimester include:  Blood tests that check for: ? Low iron levels (anemia). ? High  blood sugar that affects pregnant women (gestational diabetes) between 3 and 28 weeks. ? Rh antibodies. This is to check for a protein on red blood cells (Rh factor).  Urine tests to check for infections, diabetes, or protein in the urine.  An ultrasound to confirm the proper growth and development of the baby.  An amniocentesis to check for possible genetic problems.  Fetal screens for spina bifida and Down syndrome.  HIV (human immunodeficiency virus) testing. Routine prenatal testing includes  screening for HIV, unless you choose not to have this test.  Follow these instructions at home: Medicines  Follow your health care provider's instructions regarding medicine use. Specific medicines may be either safe or unsafe to take during pregnancy.  Take a prenatal vitamin that contains at least 600 micrograms (mcg) of folic acid.  If you develop constipation, try taking a stool softener if your health care provider approves. Eating and drinking  Eat a balanced diet that includes fresh fruits and vegetables, whole grains, good sources of protein such as meat, eggs, or tofu, and low-fat dairy. Your health care provider will help you determine the amount of weight gain that is right for you.  Avoid raw meat and uncooked cheese. These carry germs that can cause birth defects in the baby.  If you have low calcium intake from food, talk to your health care provider about whether you should take a daily calcium supplement.  Limit foods that are high in fat and processed sugars, such as fried and sweet foods.  To prevent constipation: ? Drink enough fluid to keep your urine clear or pale yellow. ? Eat foods that are high in fiber, such as fresh fruits and vegetables, whole grains, and beans. Activity  Exercise only as directed by your health care provider. Most women can continue their usual exercise routine during pregnancy. Try to exercise for 30 minutes at least 5 days a week. Stop exercising if you experience uterine contractions.  Avoid heavy lifting, wear low heel shoes, and practice good posture.  A sexual relationship may be continued unless your health care provider directs you otherwise. Relieving pain and discomfort  Wear a good support bra to prevent discomfort from breast tenderness.  Take warm sitz baths to soothe any pain or discomfort caused by hemorrhoids. Use hemorrhoid cream if your health care provider approves.  Rest with your legs elevated if you have leg cramps  or low back pain.  If you develop varicose veins, wear support hose. Elevate your feet for 15 minutes, 3-4 times a day. Limit salt in your diet. Prenatal Care  Write down your questions. Take them to your prenatal visits.  Keep all your prenatal visits as told by your health care provider. This is important. Safety  Wear your seat belt at all times when driving.  Make a list of emergency phone numbers, including numbers for family, friends, the hospital, and police and fire departments. General instructions  Ask your health care provider for a referral to a local prenatal education class. Begin classes no later than the beginning of month 6 of your pregnancy.  Ask for help if you have counseling or nutritional needs during pregnancy. Your health care provider can offer advice or refer you to specialists for help with various needs.  Do not use hot tubs, steam rooms, or saunas.  Do not douche or use tampons or scented sanitary pads.  Do not cross your legs for long periods of time.  Avoid cat litter boxes and soil  used by cats. These carry germs that can cause birth defects in the baby and possibly loss of the fetus by miscarriage or stillbirth.  Avoid all smoking, herbs, alcohol, and unprescribed drugs. Chemicals in these products can affect the formation and growth of the baby.  Do not use any products that contain nicotine or tobacco, such as cigarettes and e-cigarettes. If you need help quitting, ask your health care provider.  Visit your dentist if you have not gone yet during your pregnancy. Use a soft toothbrush to brush your teeth and be gentle when you floss. Contact a health care provider if:  You have dizziness.  You have mild pelvic cramps, pelvic pressure, or nagging pain in the abdominal area.  You have persistent nausea, vomiting, or diarrhea.  You have a bad smelling vaginal discharge.  You have pain when you urinate. Get help right away if:  You have a  fever.  You are leaking fluid from your vagina.  You have spotting or bleeding from your vagina.  You have severe abdominal cramping or pain.  You have rapid weight gain or weight loss.  You have shortness of breath with chest pain.  You notice sudden or extreme swelling of your face, hands, ankles, feet, or legs.  You have not felt your baby move in over an hour.  You have severe headaches that do not go away when you take medicine.  You have vision changes. Summary  The second trimester is from week 14 through week 27 (months 4 through 6). It is also a time when the fetus is growing rapidly.  Your body goes through many changes during pregnancy. The changes vary from woman to woman.  Avoid all smoking, herbs, alcohol, and unprescribed drugs. These chemicals affect the formation and growth your baby.  Do not use any tobacco products, such as cigarettes, chewing tobacco, and e-cigarettes. If you need help quitting, ask your health care provider.  Contact your health care provider if you have any questions. Keep all prenatal visits as told by your health care provider. This is important. This information is not intended to replace advice given to you by your health care provider. Make sure you discuss any questions you have with your health care provider. Document Released: 06/10/2001 Document Revised: 11/22/2015 Document Reviewed: 08/17/2012 Elsevier Interactive Patient Education  2017 [employer]   Bacterial Vaginosis Bacterial vaginosis is an infection of the vagina. It happens when too many germs (bacteria) grow in the vagina. This infection puts you at risk for infections from sex (STIs). Treating this infection can lower your risk for some STIs. You should also treat this if you are pregnant. It can cause your baby to be born early. Follow these instructions at home: Medicines  Take over-the-counter and prescription medicines only as told by your doctor.  Take or use  your antibiotic medicine as told by your doctor. Do not stop taking or using it even if you start to feel better. General instructions  If you your sexual partner is a woman, tell her that you have this infection. She needs to get treatment if she has symptoms. If you have a female partner, he does not need to be treated.  During treatment: ? Avoid sex. ? Do not douche. ? Avoid alcohol as told. ? Avoid breastfeeding as told.  Drink enough fluid to keep your pee (urine) clear or pale yellow.  Keep your vagina and butt (rectum) clean. ? Wash the area with warm water every day. ?  Wipe from front to back after you use the toilet.  Keep all follow-up visits as told by your doctor. This is important. Preventing this condition  Do not douche.  Use only warm water to wash around your vagina.  Use protection when you have sex. This includes: ? Latex condoms. ? Dental dams.  Limit how many people you have sex with. It is best to only have sex with the same person (be monogamous).  Get tested for STIs. Have your partner get tested.  Wear underwear that is cotton or lined with cotton.  Avoid tight pants and pantyhose. This is most important in summer.  Do not use any products that have nicotine or tobacco in them. These include cigarettes and e-cigarettes. If you need help quitting, ask your doctor.  Do not use illegal drugs.  Limit how much alcohol you drink. Contact a doctor if:  Your symptoms do not get better, even after you are treated.  You have more discharge or pain when you pee (urinate).  You have a fever.  You have pain in your belly (abdomen).  You have pain with sex.  Your bleed from your vagina between periods. Summary  This infection happens when too many germs (bacteria) grow in the vagina.  Treating this condition can lower your risk for some infections from sex (STIs).  You should also treat this if you are pregnant. It can cause early (premature)  birth.  Do not stop taking or using your antibiotic medicine even if you start to feel better. This information is not intended to replace advice given to you by your health care provider. Make sure you discuss any questions you have with your health care provider. Document Released: 03/25/2008 Document Revised: 03/01/2016 Document Reviewed: 03/01/2016 Elsevier Interactive Patient Education  2017 [employer].

## 2017-12-14 NOTE — Progress Notes (Signed)
LOW-RISK PREGNANCY VISIT Patient name: Carla Schaefer Blann MRN [id number]  Date of birth: 09/21/1995 Chief Complaint:   Routine Prenatal Visit  History of Present Illness:   Carla Schaefer Hochman is a 22 y.o. 432P0010 female at 1638w5d with an Estimated Date of Delivery: 05/26/18 being seen today for ongoing management of a low-risk pregnancy.  Today she reports lower abd sharp pain, esp w/ sex, vaginal discharge w/ odor, anxiety- no meds, was taking xanax, valium prior to pregnancy, trying to quit smoking which is making it worse. Denies SI/HI. Declines therapy, but does want to get on meds. .  Lockie Pares. Vag. Bleeding: None.  Movement: Present. denies leaking of fluid. Review of Systems:   Pertinent items are noted in HPI Denies abnormal vaginal discharge w/ itching/odor/irritation, headaches, visual changes, shortness of breath, chest pain, abdominal pain, severe nausea/vomiting, or problems with urination or bowel movements unless otherwise stated above. Pertinent History Reviewed:  Reviewed past medical,surgical, social, obstetrical and family history.  Reviewed problem list, medications and allergies. Physical Assessment:   Vitals:   12/14/17 1348  BP: 110/64  Pulse: 89  Weight: 130 lb (59 kg)  Body mass index is 25.39 kg/Schaefer.        Physical Examination:   General appearance: Well appearing, and in no distress  Mental status: Alert, oriented to person, place, and time  Skin: Warm & dry  Cardiovascular: Normal heart rate noted  Respiratory: Normal respiratory effort, no distress  Abdomen: Soft, gravid, nontender  Pelvic: spec exam: cx visually closed, mod amt thin white malodorous d/c         Extremities: Edema: None  Fetal Status:     Movement: Present    Results for orders placed or performed in visit on 12/14/17 (from the past 24 hour(s))  POCT urinalysis dipstick   Collection Time: 12/14/17  1:50 PM  Result Value Ref Range   Color, UA     Clarity, UA     Glucose, UA Negative Negative     Bilirubin, UA     Ketones, UA neg    Spec Grav, UA  1.010 - 1.025   Blood, UA neg    pH, UA  5.0 - 8.0   Protein, UA Negative Negative   Urobilinogen, UA  0.2 or 1.0 E.U./dL   Nitrite, UA neg    Leukocytes, UA Small (1+) (A) Negative   Appearance     Odor    POCT Wet Prep Mellody Drown([place]et [place])   Collection Time: 12/14/17  2:53 PM  Result Value Ref Range   Source Wet Prep POC vaginal    WBC, Wet Prep HPF POC few    Bacteria Wet Prep HPF POC Few Few   BACTERIA WET PREP MORPHOLOGY POC     Clue Cells Wet Prep HPF POC Many (A) None   Clue Cells Wet Prep Whiff POC Positive Whiff    Yeast Wet Prep HPF POC None None   KOH Wet Prep POC  None   Trichomonas Wet Prep HPF POC Absent Absent    Assessment & Plan:  1) Low-risk pregnancy G2P0010 at 4738w5d with an Estimated Date of Delivery: 05/26/18   2) BV, Rx metronidazole 500mg  BID x 7d for BV, no sex while taking. Send gc/ct  3) Dep/anx> rx zoloft 25mg  daily, understands can take few weeks for improvement, let us know if decides for therapy    Meds:  Meds ordered this encounter  Medications  . sertraline (ZOLOFT) 25 MG tablet    Sig:  Take 1 tablet (25 mg total) by mouth daily.    Dispense:  30 tablet    Refill:  6    Order Specific Question:   Supervising Provider    Answer:   Despina Hidden, LUTHER H [[id number]]  . metroNIDAZOLE (FLAGYL) 500 MG tablet    Sig: Take 1 tablet (500 mg total) by mouth 2 (two) times daily.    Dispense:  14 tablet    Refill:  0    Order Specific Question:   Supervising Provider    Answer:   Lazaro Arms [[id number]]   Labs/procedures today: 2nd IT, wet prep, gc/ct  Plan:  Continue routine obstetrical care   Reviewed: Preterm labor symptoms and general obstetric precautions including but not limited to vaginal bleeding, contractions, leaking of fluid and fetal movement were reviewed in detail with the patient.  All questions were answered  Follow-up: Return in about 2 weeks (around 12/28/2017) for LROB, [id number].  Orders  Placed This Encounter  Procedures  . GC/Chlamydia Probe Amp  . US OB Comp + 14 Wk  . INTEGRATED 2  . POCT urinalysis dipstick  . POCT Wet Prep [facility] [place])   Cheral Marker CNM, [facility] 12/14/2017 2:55 PM

## 2017-12-16 LAB — INTEGRATED 2
ADSF: 0.91
AFP MARKER: 54 ng/mL
AFP MOM: 1.34
Crown Rump Length: 75.9 mm
DIA MoM: 1.5
DIA Value: 279.9 pg/mL
Estriol, Unconjugated: 1.05 ng/mL
GEST. AGE ON COLLECTION DATE: 13.4 wk
GESTATIONAL AGE: 17.4 wk
Maternal Age at EDD: 24.1 yr
NUMBER OF FETUSES: 1
Nuchal Translucency (NT): 1.7 mm
Nuchal Translucency MoM: 0.97
PAPP-A MoM: 0.62
PAPP-A VALUE: 955.8 ng/mL
TEST RESULTS: NEGATIVE
WEIGHT: 130 [lb_av]
Weight: 125 [lb_av]
hCG MoM: 0.76
hCG Value: 22.9 IU/mL

## 2017-12-16 LAB — GC/CHLAMYDIA PROBE AMP
Chlamydia trachomatis, NAA: NEGATIVE
Neisseria gonorrhoeae by PCR: NEGATIVE

## 2017-12-29 ENCOUNTER — Ambulatory Visit (INDEPENDENT_AMBULATORY_CARE_PROVIDER_SITE_OTHER): Payer: Medicaid Other

## 2017-12-29 ENCOUNTER — Other Ambulatory Visit: Payer: Self-pay

## 2017-12-29 ENCOUNTER — Encounter: Payer: Self-pay | Admitting: Advanced Practice Midwife

## 2017-12-29 ENCOUNTER — Ambulatory Visit (INDEPENDENT_AMBULATORY_CARE_PROVIDER_SITE_OTHER): Payer: Medicaid Other | Admitting: Advanced Practice Midwife

## 2017-12-29 VITALS — BP 116/67 | HR 93 | Wt 132.0 lb

## 2017-12-29 DIAGNOSIS — Z3A18 18 weeks gestation of pregnancy: Secondary | ICD-10-CM

## 2017-12-29 DIAGNOSIS — Z363 Encounter for antenatal screening for malformations: Secondary | ICD-10-CM

## 2017-12-29 DIAGNOSIS — Z331 Pregnant state, incidental: Secondary | ICD-10-CM

## 2017-12-29 DIAGNOSIS — Z1389 Encounter for screening for other disorder: Secondary | ICD-10-CM

## 2017-12-29 DIAGNOSIS — Z3482 Encounter for supervision of other normal pregnancy, second trimester: Secondary | ICD-10-CM

## 2017-12-29 LAB — POCT URINALYSIS DIPSTICK
Glucose, UA: NEGATIVE
Ketones, UA: NEGATIVE
LEUKOCYTES UA: NEGATIVE
NITRITE UA: NEGATIVE
PROTEIN UA: NEGATIVE
RBC UA: NEGATIVE

## 2017-12-29 NOTE — Progress Notes (Signed)
  G2P0010 2737w6d Estimated Date of Delivery: 05/26/18  Blood pressure 116/67, pulse 93, weight 132 lb (59.9 kg), last menstrual period 08/19/2017.   BP weight and urine results all reviewed and noted.  Please refer to the obstetrical flow sheet for the fundal height and fetal heart rate documentation:  Patient denies any bleeding and no rupture of membranes symptoms or regular contractions. Patient is without complaints. All questions were answered.   Physical Assessment:   Vitals:   12/29/17 1443  BP: 116/67  Pulse: 93  Weight: 132 lb (59.9 kg)  Body mass index is 25.78 kg/m.        Physical Examination:   General appearance: Well appearing, and in no distress  Mental status: Alert, oriented to person, place, and time  Skin: Warm & dry  Cardiovascular: Normal heart rate noted  Respiratory: Normal respiratory effort, no distress  Abdomen: Soft, gravid, nontender  Pelvic: Cervical exam deferred         Extremities: Edema: None  Fetal Status:     Movement: Present   US 18+6 wks,cephalic,cx 3.1 cm,anterior placenta gr 0,normal ovaries,fhr 154 bpm,EFW 304 g 86 %,anatomy complete,no obvious abnormalities   zoloft is helping already.    Results for orders placed or performed in visit on 12/29/17 (from the past 24 hour(s))  POCT urinalysis dipstick   Collection Time: 12/29/17  2:45 PM  Result Value Ref Range   Color, UA     Clarity, UA     Glucose, UA Negative Negative   Bilirubin, UA     Ketones, UA neg    Spec Grav, UA  1.010 - 1.025   Blood, UA neg    pH, UA  5.0 - 8.0   Protein, UA Negative Negative   Urobilinogen, UA  0.2 or 1.0 E.U./dL   Nitrite, UA neg    Leukocytes, UA Negative Negative   Appearance     Odor       Orders Placed This Encounter  Procedures  . POCT urinalysis dipstick    Plan:  Continued routine obstetrical care,   Return in about 1 month (around 01/26/2018) for LROB.

## 2017-12-29 NOTE — Patient Instructions (Signed)
Carla Schaefer, I greatly value your feedback.  If you receive a survey following your visit with us today, we appreciate you taking the time to fill it out.  Thanks, Cathie BeamsFran Cresenzo-Dishmon, CNM     Second Trimester of Pregnancy The second trimester is from week 14 through week 27 (months 4 through 6). The second trimester is often a time when you feel your best. Your body has adjusted to being pregnant, and you begin to feel better physically. Usually, morning sickness has lessened or quit completely, you may have more energy, and you may have an increase in appetite. The second trimester is also a time when the fetus is growing rapidly. At the end of the sixth month, the fetus is about 9 inches long and weighs about 1 pounds. You will likely begin to feel the baby move (quickening) between 16 and 20 weeks of pregnancy. Body changes during your second trimester Your body continues to go through many changes during your second trimester. The changes vary from woman to woman.  Your weight will continue to increase. You will notice your lower abdomen bulging out.  You may begin to get stretch marks on your hips, abdomen, and breasts.  You may develop headaches that can be relieved by medicines. The medicines should be approved by your health care provider.  You may urinate more often because the fetus is pressing on your bladder.  You may develop or continue to have heartburn as a result of your pregnancy.  You may develop constipation because certain hormones are causing the muscles that push waste through your intestines to slow down.  You may develop hemorrhoids or swollen, bulging veins (varicose veins).  You may have back pain. This is caused by: ? Weight gain. ? Pregnancy hormones that are relaxing the joints in your pelvis. ? A shift in weight and the muscles that support your balance.  Your breasts will continue to grow and they will continue to become tender.  Your gums may  bleed and may be sensitive to brushing and flossing.  Dark spots or blotches (chloasma, mask of pregnancy) may develop on your face. This will likely fade after the baby is born.  A dark line from your belly button to the pubic area (linea nigra) may appear. This will likely fade after the baby is born.  You may have changes in your hair. These can include thickening of your hair, rapid growth, and changes in texture. Some women also have hair loss during or after pregnancy, or hair that feels dry or thin. Your hair will most likely return to normal after your baby is born.  What to expect at prenatal visits During a routine prenatal visit:  You will be weighed to make sure you and the fetus are growing normally.  Your blood pressure will be taken.  Your abdomen will be measured to track your baby's growth.  The fetal heartbeat will be listened to.  Any test results from the previous visit will be discussed.  Your health care provider may ask you:  How you are feeling.  If you are feeling the baby move.  If you have had any abnormal symptoms, such as leaking fluid, bleeding, severe headaches, or abdominal cramping.  If you are using any tobacco products, including cigarettes, chewing tobacco, and electronic cigarettes.  If you have any questions.  Other tests that may be performed during your second trimester include:  Blood tests that check for: ? Low iron levels (anemia). ?  High blood sugar that affects pregnant women (gestational diabetes) between 20 and 28 weeks. ? Rh antibodies. This is to check for a protein on red blood cells (Rh factor).  Urine tests to check for infections, diabetes, or protein in the urine.  An ultrasound to confirm the proper growth and development of the baby.  An amniocentesis to check for possible genetic problems.  Fetal screens for spina bifida and Down syndrome.  HIV (human immunodeficiency virus) testing. Routine prenatal testing  includes screening for HIV, unless you choose not to have this test.  Follow these instructions at home: Medicines  Follow your health care provider's instructions regarding medicine use. Specific medicines may be either safe or unsafe to take during pregnancy.  Take a prenatal vitamin that contains at least 600 micrograms (mcg) of folic acid.  If you develop constipation, try taking a stool softener if your health care provider approves. Eating and drinking  Eat a balanced diet that includes fresh fruits and vegetables, whole grains, good sources of protein such as meat, eggs, or tofu, and low-fat dairy. Your health care provider will help you determine the amount of weight gain that is right for you.  Avoid raw meat and uncooked cheese. These carry germs that can cause birth defects in the baby.  If you have low calcium intake from food, talk to your health care provider about whether you should take a daily calcium supplement.  Limit foods that are high in fat and processed sugars, such as fried and sweet foods.  To prevent constipation: ? Drink enough fluid to keep your urine clear or pale yellow. ? Eat foods that are high in fiber, such as fresh fruits and vegetables, whole grains, and beans. Activity  Exercise only as directed by your health care provider. Most women can continue their usual exercise routine during pregnancy. Try to exercise for 30 minutes at least 5 days a week. Stop exercising if you experience uterine contractions.  Avoid heavy lifting, wear low heel shoes, and practice good posture.  A sexual relationship may be continued unless your health care provider directs you otherwise. Relieving pain and discomfort  Wear a good support bra to prevent discomfort from breast tenderness.  Take warm sitz baths to soothe any pain or discomfort caused by hemorrhoids. Use hemorrhoid cream if your health care provider approves.  Rest with your legs elevated if you have  leg cramps or low back pain.  If you develop varicose veins, wear support hose. Elevate your feet for 15 minutes, 3-4 times a day. Limit salt in your diet. Prenatal Care  Write down your questions. Take them to your prenatal visits.  Keep all your prenatal visits as told by your health care provider. This is important. Safety  Wear your seat belt at all times when driving.  Make a list of emergency phone numbers, including numbers for family, friends, the hospital, and police and fire departments. General instructions  Ask your health care provider for a referral to a local prenatal education class. Begin classes no later than the beginning of month 6 of your pregnancy.  Ask for help if you have counseling or nutritional needs during pregnancy. Your health care provider can offer advice or refer you to specialists for help with various needs.  Do not use hot tubs, steam rooms, or saunas.  Do not douche or use tampons or scented sanitary pads.  Do not cross your legs for long periods of time.  Avoid cat litter boxes and  soil used by cats. These carry germs that can cause birth defects in the baby and possibly loss of the fetus by miscarriage or stillbirth.  Avoid all smoking, herbs, alcohol, and unprescribed drugs. Chemicals in these products can affect the formation and growth of the baby.  Do not use any products that contain nicotine or tobacco, such as cigarettes and e-cigarettes. If you need help quitting, ask your health care provider.  Visit your dentist if you have not gone yet during your pregnancy. Use a soft toothbrush to brush your teeth and be gentle when you floss. Contact a health care provider if:  You have dizziness.  You have mild pelvic cramps, pelvic pressure, or nagging pain in the abdominal area.  You have persistent nausea, vomiting, or diarrhea.  You have a bad smelling vaginal discharge.  You have pain when you urinate. Get help right away if:  You  have a fever.  You are leaking fluid from your vagina.  You have spotting or bleeding from your vagina.  You have severe abdominal cramping or pain.  You have rapid weight gain or weight loss.  You have shortness of breath with chest pain.  You notice sudden or extreme swelling of your face, hands, ankles, feet, or legs.  You have not felt your baby move in over an hour.  You have severe headaches that do not go away when you take medicine.  You have vision changes. Summary  The second trimester is from week 14 through week 27 (months 4 through 6). It is also a time when the fetus is growing rapidly.  Your body goes through many changes during pregnancy. The changes vary from woman to woman.  Avoid all smoking, herbs, alcohol, and unprescribed drugs. These chemicals affect the formation and growth your baby.  Do not use any tobacco products, such as cigarettes, chewing tobacco, and e-cigarettes. If you need help quitting, ask your health care provider.  Contact your health care provider if you have any questions. Keep all prenatal visits as told by your health care provider. This is important. This information is not intended to replace advice given to you by your health care provider. Make sure you discuss any questions you have with your health care provider.      CHILDBIRTH CLASSES [phone] is the phone number for Pregnancy Classes or hospital tours at [place] will be referred to  [email or web address] for more information on childbirth classes  At this site you may register for classes. You may sign up for a waiting list if classes are full. Please SIGN UP FOR THIS!.   When the waiting list becomes long, sometimes new classes can be added.

## 2017-12-29 NOTE — Progress Notes (Signed)
US 18+6 wks,cephalic,cx 3.1 cm,anterior placenta gr 0,normal ovaries,fhr 154 bpm,EFW 304 g 86 %,anatomy complete,no obvious abnormalities

## 2018-01-26 ENCOUNTER — Encounter: Payer: Self-pay | Admitting: Advanced Practice Midwife

## 2018-01-26 ENCOUNTER — Encounter: Payer: Medicaid Other | Admitting: Advanced Practice Midwife

## 2018-02-01 ENCOUNTER — Ambulatory Visit (INDEPENDENT_AMBULATORY_CARE_PROVIDER_SITE_OTHER): Payer: Medicaid Other | Admitting: Women's Health

## 2018-02-01 ENCOUNTER — Encounter: Payer: Self-pay | Admitting: Women's Health

## 2018-02-01 VITALS — BP 107/73 | HR 101 | Wt 137.0 lb

## 2018-02-01 DIAGNOSIS — Z1389 Encounter for screening for other disorder: Secondary | ICD-10-CM

## 2018-02-01 DIAGNOSIS — Z3A23 23 weeks gestation of pregnancy: Secondary | ICD-10-CM

## 2018-02-01 DIAGNOSIS — Z331 Pregnant state, incidental: Secondary | ICD-10-CM

## 2018-02-01 DIAGNOSIS — Z3482 Encounter for supervision of other normal pregnancy, second trimester: Secondary | ICD-10-CM

## 2018-02-01 LAB — POCT URINALYSIS DIPSTICK OB
Blood, UA: NEGATIVE
Glucose, UA: NEGATIVE — AB
Ketones, UA: NEGATIVE
LEUKOCYTES UA: NEGATIVE
NITRITE UA: NEGATIVE
PROTEIN: NEGATIVE

## 2018-02-01 NOTE — Progress Notes (Signed)
   LOW-RISK PREGNANCY VISIT Patient name: Carla Schaefer Heckler MRN [id number]  Date of birth: 02/04/1996 Chief Complaint:   Routine Prenatal Visit  History of Present Illness:   Carla Schaefer Seidner is a 22 y.o. 262P0010 female at [redacted]w[redacted]d with an Estimated Date of Delivery: 05/26/18 being seen today for ongoing management of a low-risk pregnancy.  Today she reports no complaints.  . Vag. Bleeding: None.  Movement: Present. denies leaking of fluid. Review of Systems:   Pertinent items are noted in HPI Denies abnormal vaginal discharge w/ itching/odor/irritation, headaches, visual changes, shortness of breath, chest pain, abdominal pain, severe nausea/vomiting, or problems with urination or bowel movements unless otherwise stated above. Pertinent History Reviewed:  Reviewed past medical,surgical, social, obstetrical and family history.  Reviewed problem list, medications and allergies. Physical Assessment:   Vitals:   02/01/18 1359  BP: 107/73  Pulse: (!) 101  Weight: 137 lb (62.1 kg)  Body mass index is 26.76 kg/Schaefer.        Physical Examination:   General appearance: Well appearing, and in no distress  Mental status: Alert, oriented to person, place, and time  Skin: Warm & dry  Cardiovascular: Normal heart rate noted  Respiratory: Normal respiratory effort, no distress  Abdomen: Soft, gravid, nontender  Pelvic: Cervical exam deferred         Extremities: Edema: None  Fetal Status: Fetal Heart Rate (bpm): 155 Fundal Height: 22 cm Movement: Present    Results for orders placed or performed in visit on 02/01/18 (from the past 24 hour(s))  POC Urinalysis Dipstick OB   Collection Time: 02/01/18  2:04 PM  Result Value Ref Range   Color, UA     Clarity, UA     Glucose, UA Negative (A) (none)   Bilirubin, UA     Ketones, UA neg    Spec Grav, UA  1.010 - 1.025   Blood, UA neg    pH, UA  5.0 - 8.0   POC Protein UA Negative Negative, Trace   Urobilinogen, UA  0.2 or 1.0 E.U./dL   Nitrite, UA  neg    Leukocytes, UA Negative Negative   Appearance     Odor      Assessment & Plan:  1) Low-risk pregnancy G2P0010 at [redacted]w[redacted]d with an Estimated Date of Delivery: 05/26/18    Meds: No orders of the defined types were placed in this encounter.  Labs/procedures today: none  Plan:  Continue routine obstetrical care   Reviewed: Preterm labor symptoms and general obstetric precautions including but not limited to vaginal bleeding, contractions, leaking of fluid and fetal movement were reviewed in detail with the patient.  All questions were answered  Follow-up: Return in about 1 month (around 03/01/2018) for LROB, PN2.  Orders Placed This Encounter  Procedures  . POC Urinalysis Dipstick OB   Cheral MarkerKimberly R Auden Tatar CNM, [facility] 02/01/2018 2:43 PM

## 2018-02-01 NOTE — Patient Instructions (Signed)
Carla PiggSamantha M Leano, I greatly value your feedback.  If you receive a survey following your visit with us today, we appreciate you taking the time to fill it out.  Thanks, Joellyn HaffKim Saurav Crumble, CNM, WHNP-BC   You will have your sugar test next visit.  Please do not eat or drink anything after midnight the night before you come, not even water.  You will be here for at least two hours.     Call the office [phone]) or [facility] if:  You begin to have strong, frequent contractions  Your water breaks.  Sometimes it is a big gush of fluid, sometimes it is just a trickle that keeps getting your panties wet or running down your legs  You have vaginal bleeding.  It is normal to have a small amount of spotting if your cervix was checked.   You don't feel your baby moving like normal.  If you don't, get you something to eat and drink and lay down and focus on feeling your baby move.   If your baby is still not moving like normal, you should call the office or go to [facility].  Second Trimester of Pregnancy The second trimester is from week 13 through week 28, months 4 through 6. The second trimester is often a time when you feel your best. Your body has also adjusted to being pregnant, and you begin to feel better physically. Usually, morning sickness has lessened or quit completely, you may have more energy, and you may have an increase in appetite. The second trimester is also a time when the fetus is growing rapidly. At the end of the sixth month, the fetus is about 9 inches long and weighs about 1 pounds. You will likely begin to feel the baby move (quickening) between 18 and 20 weeks of the pregnancy. BODY CHANGES Your body goes through many changes during pregnancy. The changes vary from woman to woman.   Your weight will continue to increase. You will notice your lower abdomen bulging out.  You may begin to get stretch marks on your hips, abdomen, and breasts.  You may develop  headaches that can be relieved by medicines approved by your health care provider.  You may urinate more often because the fetus is pressing on your bladder.  You may develop or continue to have heartburn as a result of your pregnancy.  You may develop constipation because certain hormones are causing the muscles that push waste through your intestines to slow down.  You may develop hemorrhoids or swollen, bulging veins (varicose veins).  You may have back pain because of the weight gain and pregnancy hormones relaxing your joints between the bones in your pelvis and as a result of a shift in weight and the muscles that support your balance.  Your breasts will continue to grow and be tender.  Your gums may bleed and may be sensitive to brushing and flossing.  Dark spots or blotches (chloasma, mask of pregnancy) may develop on your face. This will likely fade after the baby is born.  A dark line from your belly button to the pubic area (linea nigra) may appear. This will likely fade after the baby is born.  You may have changes in your hair. These can include thickening of your hair, rapid growth, and changes in texture. Some women also have hair loss during or after pregnancy, or hair that feels dry or thin. Your hair will most likely return to normal after your  baby is born. WHAT TO EXPECT AT YOUR PRENATAL VISITS During a routine prenatal visit:  You will be weighed to make sure you and the fetus are growing normally.  Your blood pressure will be taken.  Your abdomen will be measured to track your baby's growth.  The fetal heartbeat will be listened to.  Any test results from the previous visit will be discussed. Your health care provider may ask you:  How you are feeling.  If you are feeling the baby move.  If you have had any abnormal symptoms, such as leaking fluid, bleeding, severe headaches, or abdominal cramping.  If you have any questions. Other tests that may be  performed during your second trimester include:  Blood tests that check for:  Low iron levels (anemia).  Gestational diabetes (between 24 and 28 weeks).  Rh antibodies.  Urine tests to check for infections, diabetes, or protein in the urine.  An ultrasound to confirm the proper growth and development of the baby.  An amniocentesis to check for possible genetic problems.  Fetal screens for spina bifida and Down syndrome. HOME CARE INSTRUCTIONS   Avoid all smoking, herbs, alcohol, and unprescribed drugs. These chemicals affect the formation and growth of the baby.  Follow your health care provider's instructions regarding medicine use. There are medicines that are either safe or unsafe to take during pregnancy.  Exercise only as directed by your health care provider. Experiencing uterine cramps is a good sign to stop exercising.  Continue to eat regular, healthy meals.  Wear a good support bra for breast tenderness.  Do not use hot tubs, steam rooms, or saunas.  Wear your seat belt at all times when driving.  Avoid raw meat, uncooked cheese, cat litter boxes, and soil used by cats. These carry germs that can cause birth defects in the baby.  Take your prenatal vitamins.  Try taking a stool softener (if your health care provider approves) if you develop constipation. Eat more high-fiber foods, such as fresh vegetables or fruit and whole grains. Drink plenty of fluids to keep your urine clear or pale yellow.  Take warm sitz baths to soothe any pain or discomfort caused by hemorrhoids. Use hemorrhoid cream if your health care provider approves.  If you develop varicose veins, wear support hose. Elevate your feet for 15 minutes, 3-4 times a day. Limit salt in your diet.  Avoid heavy lifting, wear low heel shoes, and practice good posture.  Rest with your legs elevated if you have leg cramps or low back pain.  Visit your dentist if you have not gone yet during your pregnancy.  Use a soft toothbrush to brush your teeth and be gentle when you floss.  A sexual relationship may be continued unless your health care provider directs you otherwise.  Continue to go to all your prenatal visits as directed by your health care provider. SEEK MEDICAL CARE IF:   You have dizziness.  You have mild pelvic cramps, pelvic pressure, or nagging pain in the abdominal area.  You have persistent nausea, vomiting, or diarrhea.  You have a bad smelling vaginal discharge.  You have pain with urination. SEEK IMMEDIATE MEDICAL CARE IF:   You have a fever.  You are leaking fluid from your vagina.  You have spotting or bleeding from your vagina.  You have severe abdominal cramping or pain.  You have rapid weight gain or loss.  You have shortness of breath with chest pain.  You notice sudden or extreme   swelling of your face, hands, ankles, feet, or legs.  You have not felt your baby move in over an hour.  You have severe headaches that do not go away with medicine.  You have vision changes. Document Released: 06/10/2001 Document Revised: 06/21/2013 Document Reviewed: 08/17/2012 [facility] Patient Information 2015 [place], [place]. This information is not intended to replace advice given to you by your health care provider. Make sure you discuss any questions you have with your health care provider.

## 2018-03-02 ENCOUNTER — Other Ambulatory Visit: Payer: Medicaid Other

## 2018-03-02 ENCOUNTER — Encounter: Payer: Medicaid Other | Admitting: Women's Health

## 2018-03-03 ENCOUNTER — Other Ambulatory Visit: Payer: Medicaid Other

## 2018-03-08 ENCOUNTER — Ambulatory Visit (INDEPENDENT_AMBULATORY_CARE_PROVIDER_SITE_OTHER): Payer: Medicaid Other | Admitting: Women's Health

## 2018-03-08 ENCOUNTER — Other Ambulatory Visit: Payer: Medicaid Other

## 2018-03-08 ENCOUNTER — Encounter: Payer: Self-pay | Admitting: Women's Health

## 2018-03-08 ENCOUNTER — Other Ambulatory Visit: Payer: Self-pay

## 2018-03-08 VITALS — BP 122/73 | HR 97 | Wt 148.0 lb

## 2018-03-08 DIAGNOSIS — O99332 Smoking (tobacco) complicating pregnancy, second trimester: Secondary | ICD-10-CM

## 2018-03-08 DIAGNOSIS — Z131 Encounter for screening for diabetes mellitus: Secondary | ICD-10-CM

## 2018-03-08 DIAGNOSIS — Z3483 Encounter for supervision of other normal pregnancy, third trimester: Secondary | ICD-10-CM

## 2018-03-08 DIAGNOSIS — Z331 Pregnant state, incidental: Secondary | ICD-10-CM

## 2018-03-08 DIAGNOSIS — O99322 Drug use complicating pregnancy, second trimester: Secondary | ICD-10-CM

## 2018-03-08 DIAGNOSIS — Z1389 Encounter for screening for other disorder: Secondary | ICD-10-CM

## 2018-03-08 DIAGNOSIS — F17210 Nicotine dependence, cigarettes, uncomplicated: Secondary | ICD-10-CM

## 2018-03-08 DIAGNOSIS — Z3A28 28 weeks gestation of pregnancy: Secondary | ICD-10-CM

## 2018-03-08 DIAGNOSIS — K429 Umbilical hernia without obstruction or gangrene: Secondary | ICD-10-CM | POA: Insufficient documentation

## 2018-03-08 LAB — POCT URINALYSIS DIPSTICK OB
Blood, UA: NEGATIVE
Glucose, UA: NEGATIVE
Ketones, UA: NEGATIVE
LEUKOCYTES UA: NEGATIVE
NITRITE UA: NEGATIVE
PROTEIN: NEGATIVE

## 2018-03-08 NOTE — Progress Notes (Addendum)
LOW-RISK PREGNANCY VISIT Patient name: Carla Schaefer MRN [id number]  Date of birth: [birth year] Chief Complaint:   Routine Prenatal Visit (PN2 today)  History of Present Illness:   Carla Schaefer is a 22 y.o. G75P0010 female at [redacted]w[redacted]d with an Estimated Date of Delivery: 05/26/18 being seen today for ongoing management of a low-risk pregnancy.  Today she reports burning around umbilicus. Still smoking THC at night to help her sleep. Still smoking <1/2ppd cigarettes, depending on stress. Contractions: Not present. Vag. Bleeding: None.  Movement: Present. denies leaking of fluid. Review of Systems:   Pertinent items are noted in HPI Denies abnormal vaginal discharge w/ itching/odor/irritation, headaches, visual changes, shortness of breath, chest pain, abdominal pain, severe nausea/vomiting, or problems with urination or bowel movements unless otherwise stated above. Pertinent History Reviewed:  Reviewed past medical,surgical, social, obstetrical and family history.  Reviewed problem list, medications and allergies. Physical Assessment:   Vitals:   03/08/18 0917  BP: 122/73  Pulse: 97  Weight: 148 lb (67.1 kg)  Body mass index is 28.9 kg/m.        Physical Examination:   General appearance: Well appearing, and in no distress  Mental status: Alert, oriented to person, place, and time  Skin: Warm & dry  Cardiovascular: Normal heart rate noted  Respiratory: Normal respiratory effort, no distress  Abdomen: Soft, gravid, small umbilical hernia- couldn't tolerate trying to reduce it  Pelvic: Cervical exam deferred         Extremities: Edema: Trace  Fetal Status: Fetal Heart Rate (bpm): 154 Fundal Height: 28 cm Movement: Present    Results for orders placed or performed in visit on 03/08/18 (from the past 24 hour(s))  POC Urinalysis Dipstick OB   Collection Time: 03/08/18  9:18 AM  Result Value Ref Range   Color, UA     Clarity, UA     Glucose, UA Negative Negative   Bilirubin,  UA     Ketones, UA neg    Spec Grav, UA     Blood, UA neg    pH, UA     POC Protein UA Negative Negative, Trace   Urobilinogen, UA     Nitrite, UA neg    Leukocytes, UA Negative Negative   Appearance     Odor      Assessment & Plan:  1) Low-risk pregnancy G2P0010 at [redacted]w[redacted]d with an Estimated Date of Delivery: 05/26/18   2) Small umbilical hernia, unable to tolerate reducing it, instructed on how to do it herself at home, then place golf ball w/ belly band on top to keep compressed. Discussed s/s incarceration  3) THC use> advised cessation  4) Sleep problems> gave printed prevention/relief measures   5) Smoking> advised cessation   Meds: No orders of the defined types were placed in this encounter.  Labs/procedures today: pn2, declined tdap today, wants next visit; Declined flu shot-may want next visit. She was advised that the flu shot is recommended during pregnancy to help protect her and her baby. We discussed that it is an inactivated vaccine-so side effects are minimal, it is considered safe to receive during any trimester, and pregnant women are at a higher risk of developing potential complications from the flu, including death. She was given printed information from the CDC regarding the flu shot and the flu.    Plan:  Continue routine obstetrical care   Reviewed: Preterm labor symptoms and general obstetric precautions including but not limited to vaginal bleeding, contractions, leaking of  fluid and fetal movement were reviewed in detail with the patient.  All questions were answered  Follow-up: Return in about 4 weeks (around 04/05/2018) for LROB and flu/tdap shots.  Orders Placed This Encounter  Procedures  . POC Urinalysis Dipstick OB   Cheral Marker CNM, [facility] 03/08/2018 9:45 AM

## 2018-03-08 NOTE — Patient Instructions (Addendum)
Carla Schaefer, I greatly value your feedback.  If you receive a survey following your visit with [place] today, we appreciate you taking the time to fill it out.  Thanks, Joellyn Haff, CNM, WHNP-BC   Call the office [phone]) or go to [facility] if:  You begin to have strong, frequent contractions  Your water breaks.  Sometimes it is a big gush of fluid, sometimes it is just a trickle that keeps getting your panties wet or running down your legs  You have vaginal bleeding.  It is normal to have a small amount of spotting if your cervix was checked.   You don't feel your baby moving like normal.  If you don't, get you something to eat and drink and lay down and focus on feeling your baby move.  You should feel at least 10 movements in 2 hours.  If you don't, you should call the office or go to [facility].    Tdap Vaccine  It is recommended that you get the Tdap vaccine during the third trimester of EACH pregnancy to help protect your baby from getting pertussis (whooping cough)  27-36 weeks is the BEST time to do this so that you can pass the protection on to your baby. During pregnancy is better than after pregnancy, but if you are unable to get it during pregnancy it will be offered at the hospital.   You can get this vaccine with [place], at the health department, your family doctor, or some local pharmacies  Everyone who will be around your baby should also be up-to-date on their vaccines before the baby comes. Adults (who are not pregnant) only need 1 dose of Tdap during adulthood.   Tips to Help You Sleep Better:   Get into a bedtime routine, try to do the same thing every night before going to bed to try to help your body wind down  Warm baths  Avoid caffeine for at least 3 hours before going to sleep   Keep your room at a slightly cooler temperature, can try running a fan  Turn off TV, lights, phone, electronics  Lots of pillows if needed to help you get  comfortable  Lavender scented items can help you sleep. You can place lavender essential oil on a cotton ball and place under your pillowcase, or place in a diffuser. [employer] has a lavender scented sleep line (plug-ins, sprays, etc). Look in the pillow aisle for lavender scented pillows.   If none of the above things help, you can try 1/2 to 1 tablet of benadryl, unisom, or tylenol pm. Do not take this every night, only when you really need it.     Third Trimester of Pregnancy The third trimester is from week 29 through week 42, months 7 through 9. The third trimester is a time when the fetus is growing rapidly. At the end of the ninth month, the fetus is about 20 inches in length and weighs 6-10 pounds.  BODY CHANGES Your body goes through many changes during pregnancy. The changes vary from woman to woman.   Your weight will continue to increase. You can expect to gain 25-35 pounds (11-16 kg) by the end of the pregnancy.  You may begin to get stretch marks on your hips, abdomen, and breasts.  You may urinate more often because the fetus is moving lower into your pelvis and pressing on your bladder.  You may develop or continue to have heartburn as a result of your pregnancy.  You may develop constipation because certain hormones are causing the muscles that push waste through your intestines to slow down.  You may develop hemorrhoids or swollen, bulging veins (varicose veins).  You may have pelvic pain because of the weight gain and pregnancy hormones relaxing your joints between the bones in your pelvis. Backaches may result from overexertion of the muscles supporting your posture.  You may have changes in your hair. These can include thickening of your hair, rapid growth, and changes in texture. Some women also have hair loss during or after pregnancy, or hair that feels dry or thin. Your hair will most likely return to normal after your baby is born.  Your breasts will continue to  grow and be tender. A yellow discharge may leak from your breasts called colostrum.  Your belly button may stick out.  You may feel short of breath because of your expanding uterus.  You may notice the fetus "dropping," or moving lower in your abdomen.  You may have a bloody mucus discharge. This usually occurs a few days to a week before labor begins.  Your cervix becomes thin and soft (effaced) near your due date. WHAT TO EXPECT AT YOUR PRENATAL EXAMS  You will have prenatal exams every 2 weeks until week 36. Then, you will have weekly prenatal exams. During a routine prenatal visit:  You will be weighed to make sure you and the fetus are growing normally.  Your blood pressure is taken.  Your abdomen will be measured to track your baby's growth.  The fetal heartbeat will be listened to.  Any test results from the previous visit will be discussed.  You may have a cervical check near your due date to see if you have effaced. At around 36 weeks, your caregiver will check your cervix. At the same time, your caregiver will also perform a test on the secretions of the vaginal tissue. This test is to determine if a type of bacteria, Group B streptococcus, is present. Your caregiver will explain this further. Your caregiver may ask you:  What your birth plan is.  How you are feeling.  If you are feeling the baby move.  If you have had any abnormal symptoms, such as leaking fluid, bleeding, severe headaches, or abdominal cramping.  If you have any questions. Other tests or screenings that may be performed during your third trimester include:  Blood tests that check for low iron levels (anemia).  Fetal testing to check the health, activity level, and growth of the fetus. Testing is done if you have certain medical conditions or if there are problems during the pregnancy. FALSE LABOR You may feel small, irregular contractions that eventually go away. These are called Braxton Hicks  contractions, or false labor. Contractions may last for hours, days, or even weeks before true labor sets in. If contractions come at regular intervals, intensify, or become painful, it is best to be seen by your caregiver.  SIGNS OF LABOR   Menstrual-like cramps.  Contractions that are 5 minutes apart or less.  Contractions that start on the top of the uterus and spread down to the lower abdomen and back.  A sense of increased pelvic pressure or back pain.  A watery or bloody mucus discharge that comes from the vagina. If you have any of these signs before the 37th week of pregnancy, call your caregiver right away. You need to go to the hospital to get checked immediately. HOME CARE INSTRUCTIONS   Avoid all  smoking, herbs, alcohol, and unprescribed drugs. These chemicals affect the formation and growth of the baby.  Follow your caregiver's instructions regarding medicine use. There are medicines that are either safe or unsafe to take during pregnancy.  Exercise only as directed by your caregiver. Experiencing uterine cramps is a good sign to stop exercising.  Continue to eat regular, healthy meals.  Wear a good support bra for breast tenderness.  Do not use hot tubs, steam rooms, or saunas.  Wear your seat belt at all times when driving.  Avoid raw meat, uncooked cheese, cat litter boxes, and soil used by cats. These carry germs that can cause birth defects in the baby.  Take your prenatal vitamins.  Try taking a stool softener (if your caregiver approves) if you develop constipation. Eat more high-fiber foods, such as fresh vegetables or fruit and whole grains. Drink plenty of fluids to keep your urine clear or pale yellow.  Take warm sitz baths to soothe any pain or discomfort caused by hemorrhoids. Use hemorrhoid cream if your caregiver approves.  If you develop varicose veins, wear support hose. Elevate your feet for 15 minutes, 3-4 times a day. Limit salt in your  diet.  Avoid heavy lifting, wear low heal shoes, and practice good posture.  Rest a lot with your legs elevated if you have leg cramps or low back pain.  Visit your dentist if you have not gone during your pregnancy. Use a soft toothbrush to brush your teeth and be gentle when you floss.  A sexual relationship may be continued unless your caregiver directs you otherwise.  Do not travel far distances unless it is absolutely necessary and only with the approval of your caregiver.  Take prenatal classes to understand, practice, and ask questions about the labor and delivery.  Make a trial run to the hospital.  Pack your hospital bag.  Prepare the baby's nursery.  Continue to go to all your prenatal visits as directed by your caregiver. SEEK MEDICAL CARE IF:  You are unsure if you are in labor or if your water has broken.  You have dizziness.  You have mild pelvic cramps, pelvic pressure, or nagging pain in your abdominal area.  You have persistent nausea, vomiting, or diarrhea.  You have a bad smelling vaginal discharge.  You have pain with urination. SEEK IMMEDIATE MEDICAL CARE IF:   You have a fever.  You are leaking fluid from your vagina.  You have spotting or bleeding from your vagina.  You have severe abdominal cramping or pain.  You have rapid weight loss or gain.  You have shortness of breath with chest pain.  You notice sudden or extreme swelling of your face, hands, ankles, feet, or legs.  You have not felt your baby move in over an hour.  You have severe headaches that do not go away with medicine.  You have vision changes. Document Released: 06/10/2001 Document Revised: 06/21/2013 Document Reviewed: 08/17/2012 [facility] Patient Information 2015 [place], [place]. This information is not intended to replace advice given to you by your health care provider. Make sure you discuss any questions you have with your health care provider.   PROTECT YOURSELF &  YOUR BABY FROM THE FLU! Because you are pregnant, we at [facility], along with the Centers for Disease Control (CDC), recommend that you receive the flu vaccine to protect yourself and your baby from the flu. The flu is more likely to cause severe illness in pregnant women than in women of  reproductive age who are not pregnant. Changes in the immune system, heart, and lungs during pregnancy make pregnant women (and women up to two weeks postpartum) more prone to severe illness from flu, including illness resulting in hospitalization. Flu also may be harmful for a pregnant woman's developing baby. A common flu symptom is fever, which may be associated with neural tube defects and other adverse outcomes for a developing baby. Getting vaccinated can also help protect a baby after birth from flu. (Mom passes antibodies onto the developing baby during her pregnancy.)  A Flu Vaccine is the Best Protection Against Flu Getting a flu vaccine is the first and most important step in protecting against flu. Pregnant women should get a flu shot and not the live attenuated influenza vaccine (LAIV), also known as nasal spray flu vaccine. Flu vaccines given during pregnancy help protect both the mother and her baby from flu. Vaccination has been shown to reduce the risk of flu-associated acute respiratory infection in pregnant women by up to one-half. A 2018 study showed that getting a flu shot reduced a pregnant woman's risk of being hospitalized with flu by an average of 40 percent. Pregnant women who get a flu vaccine are also helping to protect their babies from flu illness for the first several months after their birth, when they are too young to get vaccinated.   A Long Record of Safety for Flu Shots in Pregnant Women Flu shots have been given to millions of pregnant women over many years with a good safety record. There is a lot of evidence that flu vaccines can be given safely during pregnancy; though these data are  limited for the first trimester. The CDC recommends that pregnant women get vaccinated during any trimester of their pregnancy. It is very important for pregnant women to get the flu shot.   Other Preventive Actions In addition to getting a flu shot, pregnant women should take the same everyday preventive actions the CDC recommends of everyone, including covering coughs, washing hands often, and avoiding people who are sick.  Symptoms and Treatment If you get sick with flu symptoms call your doctor right away. There are antiviral drugs that can treat flu illness and prevent serious flu complications. The CDC recommends prompt treatment for people who have influenza infection or suspected influenza infection and who are at high risk of serious flu complications, such as people with asthma, diabetes (including gestational diabetes), or heart disease. Early treatment of influenza in hospitalized pregnant women has been shown to reduce the length of the hospital stay.  Symptoms Flu symptoms include fever, cough, sore throat, runny or stuffy nose, body aches, headache, chills and fatigue. Some people may also have vomiting and diarrhea. People may be infected with the flu and have respiratory symptoms without a fever.  Early Treatment is Important for Pregnant Women Treatment should begin as soon as possible because antiviral drugs work best when started early (within 48 hours after symptoms start). Antiviral drugs can make your flu illness milder and make you feel better faster. They may also prevent serious health problems that can result from flu illness. Oral oseltamivir (Tamiflu) is the preferred treatment for pregnant women because it has the most studies available to suggest that it is safe and beneficial. Antiviral drugs require a prescription from your provider. Having a fever caused by flu infection or other infections early in pregnancy may be linked to birth defects in a baby. In addition to  taking antiviral drugs, pregnant women  who get a fever should treat their fever with Tylenol (acetaminophen) and contact their provider immediately.  When to Seek Emergency Medical Care If you are pregnant and have any of these signs, seek care immediately:  Difficulty breathing or shortness of breath  Pain or pressure in the chest or abdomen  Sudden dizziness  Confusion  Severe or persistent vomiting  High fever that is not responding to Tylenol (or store brand equivalent)  Decreased or no movement of your baby  [email or web address]

## 2018-03-09 LAB — CBC
HEMATOCRIT: 35.5 % (ref 34.0–46.6)
HEMOGLOBIN: 12 g/dL (ref 11.1–15.9)
MCH: 31.1 pg (ref 26.6–33.0)
MCHC: 33.8 g/dL (ref 31.5–35.7)
MCV: 92 fL (ref 79–97)
Platelets: 315 10*3/uL (ref 150–450)
RBC: 3.86 x10E6/uL (ref 3.77–5.28)
RDW: 13.1 % (ref 12.3–15.4)
WBC: 14.9 10*3/uL — ABNORMAL HIGH (ref 3.4–10.8)

## 2018-03-09 LAB — GLUCOSE TOLERANCE, 2 HOURS W/ 1HR
GLUCOSE, 1 HOUR: 194 mg/dL — AB (ref 65–179)
GLUCOSE, 2 HOUR: 139 mg/dL (ref 65–152)
Glucose, Fasting: 97 mg/dL — ABNORMAL HIGH (ref 65–91)

## 2018-03-09 LAB — RPR: RPR Ser Ql: NONREACTIVE

## 2018-03-09 LAB — HIV ANTIBODY (ROUTINE TESTING W REFLEX): HIV SCREEN 4TH GENERATION: NONREACTIVE

## 2018-03-09 LAB — ANTIBODY SCREEN: Antibody Screen: NEGATIVE

## 2018-03-11 ENCOUNTER — Other Ambulatory Visit: Payer: Self-pay | Admitting: Women's Health

## 2018-03-11 ENCOUNTER — Encounter: Payer: Self-pay | Admitting: Women's Health

## 2018-03-11 DIAGNOSIS — O24410 Gestational diabetes mellitus in pregnancy, diet controlled: Secondary | ICD-10-CM

## 2018-03-11 DIAGNOSIS — O24419 Gestational diabetes mellitus in pregnancy, unspecified control: Secondary | ICD-10-CM | POA: Insufficient documentation

## 2018-03-12 ENCOUNTER — Telehealth: Payer: Self-pay | Admitting: *Deleted

## 2018-03-12 NOTE — Telephone Encounter (Signed)
Unable to leave message with patient.  V.O. For glucose meter, lancets and strips called to pharmacy.

## 2018-03-12 NOTE — Telephone Encounter (Signed)
Patient informed she did not pass sugar test and will be checking blood sugar 4 times daily.. Needs to bring log to appt and should get a call from dietician in about a week. Verbalized understanding.

## 2018-04-05 ENCOUNTER — Encounter: Payer: Self-pay | Admitting: Women's Health

## 2018-04-05 ENCOUNTER — Other Ambulatory Visit: Payer: Self-pay

## 2018-04-05 ENCOUNTER — Ambulatory Visit (INDEPENDENT_AMBULATORY_CARE_PROVIDER_SITE_OTHER): Payer: Medicaid Other | Admitting: Women's Health

## 2018-04-05 VITALS — BP 125/76 | HR 106 | Wt 147.0 lb

## 2018-04-05 DIAGNOSIS — Z23 Encounter for immunization: Secondary | ICD-10-CM

## 2018-04-05 DIAGNOSIS — Z3A32 32 weeks gestation of pregnancy: Secondary | ICD-10-CM

## 2018-04-05 DIAGNOSIS — O24410 Gestational diabetes mellitus in pregnancy, diet controlled: Secondary | ICD-10-CM

## 2018-04-05 DIAGNOSIS — O0993 Supervision of high risk pregnancy, unspecified, third trimester: Secondary | ICD-10-CM

## 2018-04-05 DIAGNOSIS — Z331 Pregnant state, incidental: Secondary | ICD-10-CM

## 2018-04-05 DIAGNOSIS — Z1389 Encounter for screening for other disorder: Secondary | ICD-10-CM

## 2018-04-05 LAB — POCT URINALYSIS DIPSTICK OB
Blood, UA: NEGATIVE
GLUCOSE, UA: NEGATIVE
KETONES UA: NEGATIVE
Leukocytes, UA: NEGATIVE
Nitrite, UA: NEGATIVE
POC,PROTEIN,UA: NEGATIVE

## 2018-04-05 NOTE — Progress Notes (Signed)
   HIGH-RISK PREGNANCY VISIT Patient name: Carla Schaefer MRN [id number]  Date of birth: 06-21-1996 Chief Complaint:   High Risk Gestation  History of Present Illness:   Carla Schaefer is a 22 y.o. G66P0010 female at [redacted]w[redacted]d with an Estimated Date of Delivery: 05/26/18 being seen today for ongoing management of a high-risk pregnancy complicated by A1DM.  Today she reports all sugars wnl, didn't bring log. Reports hip pain.  Contractions: Not present. Vag. Bleeding: None.  Movement: Present. denies leaking of fluid.  Review of Systems:   Pertinent items are noted in HPI Denies abnormal vaginal discharge w/ itching/odor/irritation, headaches, visual changes, shortness of breath, chest pain, abdominal pain, severe nausea/vomiting, or problems with urination or bowel movements unless otherwise stated above. Pertinent History Reviewed:  Reviewed past medical,surgical, social, obstetrical and family history.  Reviewed problem list, medications and allergies. Physical Assessment:   Vitals:   04/05/18 1204  BP: 125/76  Pulse: (!) 106  Weight: 147 lb (66.7 kg)  Body mass index is 28.71 kg/m.           Physical Examination:   General appearance: alert, well appearing, and in no distress  Mental status: alert, oriented to person, place, and time  Skin: warm & dry   Extremities: Edema: Trace    Cardiovascular: normal heart rate noted  Respiratory: normal respiratory effort, no distress  Abdomen: gravid, soft, non-tender  Pelvic: Cervical exam deferred         Fetal Status: Fetal Heart Rate (bpm): 144 Fundal Height: 32 cm Movement: Present    Fetal Surveillance Testing today: doppler   Results for orders placed or performed in visit on 04/05/18 (from the past 24 hour(s))  POC Urinalysis Dipstick OB   Collection Time: 04/05/18 12:06 PM  Result Value Ref Range   Color, UA     Clarity, UA     Glucose, UA Negative Negative   Bilirubin, UA     Ketones, UA neg    Spec Grav, UA     Blood,  UA neg    pH, UA     POC Protein UA Negative Negative, Trace   Urobilinogen, UA     Nitrite, UA neg    Leukocytes, UA Negative Negative   Appearance     Odor      Assessment & Plan:  1) High-risk pregnancy G2P0010 at [redacted]w[redacted]d with an Estimated Date of Delivery: 05/26/18   2) A1DM, stable, bring log to appts!  Meds: No orders of the defined types were placed in this encounter.  Labs/procedures today: tdap & flu shot  Treatment Plan:  Growth u/s @ 36-38wks     Deliver @ 40wks  Reviewed: Preterm labor symptoms and general obstetric precautions including but not limited to vaginal bleeding, contractions, leaking of fluid and fetal movement were reviewed in detail with the patient.  All questions were answered.  Follow-up: Return in about 2 weeks (around 04/19/2018) for HROB.  Orders Placed This Encounter  Procedures  . Tdap vaccine greater than or equal to 7yo IM  . Flu Vaccine QUAD 36+ mos IM  . POC Urinalysis Dipstick OB   Cheral Marker CNM, WHNP-BC 04/05/2018 1:00 PM

## 2018-04-05 NOTE — Patient Instructions (Signed)
Carla Schaefer, I greatly value your feedback.  If you receive a survey following your visit with [place] today, we appreciate you taking the time to fill it out.  Thanks, Joellyn Haff, CNM, WHNP-BC   Call the office [phone]) or go to [facility] if:  You begin to have strong, frequent contractions  Your water breaks.  Sometimes it is a big gush of fluid, sometimes it is just a trickle that keeps getting your panties wet or running down your legs  You have vaginal bleeding.  It is normal to have a small amount of spotting if your cervix was checked.   You don't feel your baby moving like normal.  If you don't, get you something to eat and drink and lay down and focus on feeling your baby move.  You should feel at least 10 movements in 2 hours.  If you don't, you should call the office or go to [facility].    Tdap Vaccine  It is recommended that you get the Tdap vaccine during the third trimester of EACH pregnancy to help protect your baby from getting pertussis (whooping cough)  27-36 weeks is the BEST time to do this so that you can pass the protection on to your baby. During pregnancy is better than after pregnancy, but if you are unable to get it during pregnancy it will be offered at the hospital.   You can get this vaccine with [place], at the health department, your family doctor, or some local pharmacies  Everyone who will be around your baby should also be up-to-date on their vaccines before the baby comes. Adults (who are not pregnant) only need 1 dose of Tdap during adulthood.   Third Trimester of Pregnancy The third trimester is from week 29 through week 42, months 7 through 9. The third trimester is a time when the fetus is growing rapidly. At the end of the ninth month, the fetus is about 20 inches in length and weighs 6-10 pounds.  BODY CHANGES Your body goes through many changes during pregnancy. The changes vary from woman to woman.   Your weight will continue to  increase. You can expect to gain 25-35 pounds (11-16 kg) by the end of the pregnancy.  You may begin to get stretch marks on your hips, abdomen, and breasts.  You may urinate more often because the fetus is moving lower into your pelvis and pressing on your bladder.  You may develop or continue to have heartburn as a result of your pregnancy.  You may develop constipation because certain hormones are causing the muscles that push waste through your intestines to slow down.  You may develop hemorrhoids or swollen, bulging veins (varicose veins).  You may have pelvic pain because of the weight gain and pregnancy hormones relaxing your joints between the bones in your pelvis. Backaches may result from overexertion of the muscles supporting your posture.  You may have changes in your hair. These can include thickening of your hair, rapid growth, and changes in texture. Some women also have hair loss during or after pregnancy, or hair that feels dry or thin. Your hair will most likely return to normal after your baby is born.  Your breasts will continue to grow and be tender. A yellow discharge may leak from your breasts called colostrum.  Your belly button may stick out.  You may feel short of breath because of your expanding uterus.  You may notice the fetus "dropping," or moving lower  in your abdomen.  You may have a bloody mucus discharge. This usually occurs a few days to a week before labor begins.  Your cervix becomes thin and soft (effaced) near your due date. WHAT TO EXPECT AT YOUR PRENATAL EXAMS  You will have prenatal exams every 2 weeks until week 36. Then, you will have weekly prenatal exams. During a routine prenatal visit:  You will be weighed to make sure you and the fetus are growing normally.  Your blood pressure is taken.  Your abdomen will be measured to track your baby's growth.  The fetal heartbeat will be listened to.  Any test results from the previous visit  will be discussed.  You may have a cervical check near your due date to see if you have effaced. At around 36 weeks, your caregiver will check your cervix. At the same time, your caregiver will also perform a test on the secretions of the vaginal tissue. This test is to determine if a type of bacteria, Group B streptococcus, is present. Your caregiver will explain this further. Your caregiver may ask you:  What your birth plan is.  How you are feeling.  If you are feeling the baby move.  If you have had any abnormal symptoms, such as leaking fluid, bleeding, severe headaches, or abdominal cramping.  If you have any questions. Other tests or screenings that may be performed during your third trimester include:  Blood tests that check for low iron levels (anemia).  Fetal testing to check the health, activity level, and growth of the fetus. Testing is done if you have certain medical conditions or if there are problems during the pregnancy. FALSE LABOR You may feel small, irregular contractions that eventually go away. These are called Braxton Hicks contractions, or false labor. Contractions may last for hours, days, or even weeks before true labor sets in. If contractions come at regular intervals, intensify, or become painful, it is best to be seen by your caregiver.  SIGNS OF LABOR   Menstrual-like cramps.  Contractions that are 5 minutes apart or less.  Contractions that start on the top of the uterus and spread down to the lower abdomen and back.  A sense of increased pelvic pressure or back pain.  A watery or bloody mucus discharge that comes from the vagina. If you have any of these signs before the 37th week of pregnancy, call your caregiver right away. You need to go to the hospital to get checked immediately. HOME CARE INSTRUCTIONS   Avoid all smoking, herbs, alcohol, and unprescribed drugs. These chemicals affect the formation and growth of the baby.  Follow your  caregiver's instructions regarding medicine use. There are medicines that are either safe or unsafe to take during pregnancy.  Exercise only as directed by your caregiver. Experiencing uterine cramps is a good sign to stop exercising.  Continue to eat regular, healthy meals.  Wear a good support bra for breast tenderness.  Do not use hot tubs, steam rooms, or saunas.  Wear your seat belt at all times when driving.  Avoid raw meat, uncooked cheese, cat litter boxes, and soil used by cats. These carry germs that can cause birth defects in the baby.  Take your prenatal vitamins.  Try taking a stool softener (if your caregiver approves) if you develop constipation. Eat more high-fiber foods, such as fresh vegetables or fruit and whole grains. Drink plenty of fluids to keep your urine clear or pale yellow.  Take warm sitz baths   to soothe any pain or discomfort caused by hemorrhoids. Use hemorrhoid cream if your caregiver approves.  If you develop varicose veins, wear support hose. Elevate your feet for 15 minutes, 3-4 times a day. Limit salt in your diet.  Avoid heavy lifting, wear low heal shoes, and practice good posture.  Rest a lot with your legs elevated if you have leg cramps or low back pain.  Visit your dentist if you have not gone during your pregnancy. Use a soft toothbrush to brush your teeth and be gentle when you floss.  A sexual relationship may be continued unless your caregiver directs you otherwise.  Do not travel far distances unless it is absolutely necessary and only with the approval of your caregiver.  Take prenatal classes to understand, practice, and ask questions about the labor and delivery.  Make a trial run to the hospital.  Pack your hospital bag.  Prepare the baby's nursery.  Continue to go to all your prenatal visits as directed by your caregiver. SEEK MEDICAL CARE IF:  You are unsure if you are in labor or if your water has broken.  You have  dizziness.  You have mild pelvic cramps, pelvic pressure, or nagging pain in your abdominal area.  You have persistent nausea, vomiting, or diarrhea.  You have a bad smelling vaginal discharge.  You have pain with urination. SEEK IMMEDIATE MEDICAL CARE IF:   You have a fever.  You are leaking fluid from your vagina.  You have spotting or bleeding from your vagina.  You have severe abdominal cramping or pain.  You have rapid weight loss or gain.  You have shortness of breath with chest pain.  You notice sudden or extreme swelling of your face, hands, ankles, feet, or legs.  You have not felt your baby move in over an hour.  You have severe headaches that do not go away with medicine.  You have vision changes. Document Released: 06/10/2001 Document Revised: 06/21/2013 Document Reviewed: 08/17/2012 [facility] Patient Information 2015 [place], [place]. This information is not intended to replace advice given to you by your health care provider. Make sure you discuss any questions you have with your health care provider.

## 2018-04-14 ENCOUNTER — Telehealth: Payer: Self-pay | Admitting: *Deleted

## 2018-04-14 NOTE — Telephone Encounter (Signed)
Patient states during the night last night she started having some lower back pain.  She has noticed when the baby kicks, "liquid comes out". She is also experiencing some itching as well.  Is not having to wear a panty liner and is not wet a lot.  Pt is scheduled for appt tomorrow.   Offered this afternoon but patient stated she wanted to wait so that she would not be by herself.

## 2018-04-15 ENCOUNTER — Other Ambulatory Visit: Payer: Self-pay | Admitting: *Deleted

## 2018-04-15 ENCOUNTER — Encounter: Payer: Self-pay | Admitting: Advanced Practice Midwife

## 2018-04-15 ENCOUNTER — Ambulatory Visit (INDEPENDENT_AMBULATORY_CARE_PROVIDER_SITE_OTHER): Payer: Medicaid Other | Admitting: Advanced Practice Midwife

## 2018-04-15 ENCOUNTER — Other Ambulatory Visit: Payer: Self-pay

## 2018-04-15 VITALS — BP 129/75 | HR 101 | Wt 147.0 lb

## 2018-04-15 DIAGNOSIS — R1011 Right upper quadrant pain: Secondary | ICD-10-CM

## 2018-04-15 DIAGNOSIS — Z1389 Encounter for screening for other disorder: Secondary | ICD-10-CM

## 2018-04-15 DIAGNOSIS — O24419 Gestational diabetes mellitus in pregnancy, unspecified control: Secondary | ICD-10-CM

## 2018-04-15 DIAGNOSIS — Z3A34 34 weeks gestation of pregnancy: Secondary | ICD-10-CM

## 2018-04-15 DIAGNOSIS — Z331 Pregnant state, incidental: Secondary | ICD-10-CM

## 2018-04-15 DIAGNOSIS — O0993 Supervision of high risk pregnancy, unspecified, third trimester: Secondary | ICD-10-CM

## 2018-04-15 LAB — POCT URINALYSIS DIPSTICK OB
Blood, UA: NEGATIVE
GLUCOSE, UA: NEGATIVE
Ketones, UA: NEGATIVE
Leukocytes, UA: NEGATIVE
Nitrite, UA: NEGATIVE
POC,PROTEIN,UA: NEGATIVE

## 2018-04-15 NOTE — Patient Instructions (Addendum)
AM I IN LABOR? What is labor? Labor is the work that your body does to birth your baby. Your uterus (the womb) contracts. Your cervix (the mouth of the uterus) opens. You will push your baby out into the world.  What do contractions (labor pains) feel like? When they first start, contractions usually feel like cramps during your period. Sometimes you feel pain in your back. Most often, contractions feel like muscles pulling painfully in your lower belly. At first, the contractions will probably be 15 to 20 minutes apart. They will not feel too painful. As labor goes on, the contractions get stronger, closer together, and more painful.  How do I time the contractions? Time your contractions by counting the number of minutes from the start of one contraction to the start of the next contraction.  What should I do when the contractions start? If it is night and you can sleep, sleep. If it happens during the day, here are some things you can do to take care of yourself at home: ? Walk. If the pains you are having are real labor, walking will make the contractions come faster and harder. If the contractions are not going to continue and be real labor, walking will make the contractions slow down. ? Take a shower or bath. This will help you relax. ? Eat. Labor is a big event. It takes a lot of energy. ? Drink water. Not drinking enough water can cause false labor (contractions that hurt but do not open your cervix). If this is true labor, drinking water will help you have strength to get through your labor. ? Take a nap. Get all the rest you can. ? Get a massage. If your labor is in your back, a strong massage on your lower back may feel very good. Getting a foot massage is always good. ? Don't panic. You can do this. Your body was made for this. You are strong!  When should I go to the hospital or call my health care provider? ? Your contractions have been 5 minutes apart or less for at least 1  hour. ? If several contractions are so painful you cannot walk or talk during one. ? Your bag of waters breaks. (You may have a big gush of water or just water that runs down your legs when you walk.)  Are there other reasons to call my health care provider? Yes, you should call your health care provider or go to the hospital if you start to bleed like you are having a period- blood that soaks your underwear or runs down your legs, if you have sudden severe pain, if your baby has not moved for several hours, or if you are leaking green fluid. The rule is as follows: If you are very concerned about something, call.   Safe Medications in Pregnancy   Acne: Benzoyl Peroxide Salicylic Acid  Backache/Headache: Tylenol: 2 regular strength every 4 hours OR              2 Extra strength every 6 hours  Colds/Coughs/Allergies: Benadryl (alcohol free) 25 mg every 6 hours as needed Breath right strips Claritin Cepacol throat lozenges Chloraseptic throat spray Cold-Eeze- up to three times per day Cough drops, alcohol free Flonase (by prescription only) Guaifenesin Mucinex Robitussin DM (plain only, alcohol free) Saline nasal spray/drops Sudafed (pseudoephedrine) & Actifed ** use only after [redacted] weeks gestation and if you do not have high blood pressure Tylenol Vicks Vaporub Zinc lozenges Zyrtec     Colace Ducolax suppositories Fleet enema Glycerin suppositories Metamucil Milk of magnesia Miralax Senokot Smooth move tea  Diarrhea: Kaopectate Imodium A-D  *NO pepto Bismol  Hemorrhoids: Anusol Anusol HC Preparation H Tucks  Indigestion: Tums Maalox Mylanta Zantac  Pepcid  Insomnia: Benadryl (alcohol free) 25mg  every 6 hours as needed Tylenol PM Unisom, no Gelcaps  Leg Cramps: Tums MagGel  Nausea/Vomiting:  Bonine Dramamine Emetrol Ginger extract Sea bands Meclizine  Nausea medication to take during pregnancy:  Unisom (doxylamine succinate 25  mg tablets) Take one tablet daily at bedtime. If symptoms are not adequately controlled, the dose can be increased to a maximum recommended dose of two tablets daily (1/2 tablet in the morning, 1/2 tablet mid-afternoon and one at bedtime). Vitamin B6 100mg  tablets. Take one tablet twice a day (up to 200 mg per day).  Skin Rashes: Aveeno products Benadryl cream or 25mg  every 6 hours as needed Calamine LotionRUQ pain 1% cortisone cream  Yeast infection: Gyne-lotrimin 7 Monistat 7   **If taking multiple medications, please check labels to avoid duplicating the same active ingredients **take medication as directed on the label ** Do not exceed 4000 mg of tylenol in 24 hours **Do not take medications that contain aspirin or ibuprofen  BENEFITS OF BREASTFEEDING Many women wonder if they should breastfeed. Research shows that breast milk contains the perfect balance of vitamins, protein and fat that your baby needs to grow. It also contains antibodies that help your baby's immune system to fight off viruses and bacteria and can reduce the risk of sudden infant death syndrome (SIDS). In addition, the colostrum (a fluid secreted from the breast in the first few days after delivery) helps your newborn's digestive system to grow and function well. Breast milk is easier to digest than formula. Also, if your baby is born preterm, breast milk can help to reduce both short- and long-term health problems. BENEFITS OF BREASTFEEDING FOR MOM . Breastfeeding causes a hormone to be released that helps the uterus to contract and return to its normal size more quickly. . It aids in postpartum weight loss, reduces risk of breast and ovarian cancer, heart disease and rheumatoid arthritis. . It decreases the amount of bleeding after the baby is born. benefits of breastfeeding for baby . Provides comfort and nutrition . Protects baby against - Obesity - Diabetes - Asthma - Childhood cancers - Heart disease -  Ear infections - Diarrhea - Pneumonia - Stomach problems - Serious allergies - Skin rashes . Promotes growth and development . Reduces the risk of baby having Sudden Infant Death Syndrome (SIDS) only breastmilk for the first 6 months . Protects baby against diseases/allergies . It's the perfect amount for tiny bellies . It restores baby's energy . Provides the best nutrition for baby . Giving water or formula can make baby more likely to get sick, decrease Mom's milk supply, make baby less content with breastfeeding Skin to Skin After delivery, the staff will place your baby on your chest. This helps with the following: . Regulates baby's temperature, breathing, heart rate and blood sugar . Increases Mom's milk supply . Promotes bonding . Keeps baby and Mom calm and decreases baby's crying Rooming In Your baby will stay in your room with you for the entire time you are in the hospital. This helps with the following: . Allows Mom to learn baby's feeding cues - Fluttering eyes - Sucking on tongue or hand - Rooting (opens mouth and turns head) - Nuzzling into the breast - Bringing  hand to mouth . Allows breastfeeding on demand (when your baby is ready) . Helps baby to be calm and content . Ensures a good milk supply . Prevents complications with breastfeeding . Allows parents to learn to care for baby . Allows you to request assistance with breastfeeding Importance of a good latch . Increases milk transfer to baby - baby gets enough milk . Ensures you have enough milk for your baby . Decreases nipple soreness . Don't use pacifiers and bottles - these cause baby to suck differently than breastfeeding . Promotes continuation of breastfeeding Risks of Formula Supplementation with Breastfeeding Giving your infant formula in addition to your breast-milk EXCEPT when medically necessary can lead to: Marland Kitchen Decreases your milk supply  . Loss of confidence in yourself for providing baby's  nutrition  . Engorgement and possibly mastitis  . Asthma & allergies in the baby BREASTFEEDING FAQS How long should I breastfeed my baby? It is recommended that you provide your baby with breast milk only for the first 6 months and then continue for the first year and longer as desired. During the first few weeks after birth, your baby will need to feed 8-12 times every 24 hours, or every 2-3 hours. They will likely feed for 15-30 minutes. How can I help my baby begin breastfeeding? Babies are born with an instinct to breastfeed. A healthy baby can begin breastfeeding right away without specific help. At the hospital, a nurse (or lactation consultant) will help you begin the process and will give you tips on good positioning. It may be helpful to take a breastfeeding class before you deliver in order to know what to expect. How can I help my baby latch on? In order to assist your baby in latching-on, cup your breast in your hand and stroke your baby's lower lip with your nipple to stimulate your baby's rooting reflex. Your baby will look like he or she is yawning, at which point you should bring the baby towards your breast, while aiming the nipple at the roof of his or her mouth. Remember to bring the baby towards you and not your breast towards the baby. How can I tell if my baby is latched-on? Your baby will have all of your nipple and part of the dark area around the nipple in his or her mouth and your baby's nose will be touching your breast. You should see or hear the baby swallowing. If the baby is not latched-on properly, start the process over. To remove the suction, insert a clean finger between your breast and the baby's mouth. Should I switch breasts during feeding? After feeding on one side, switch the baby to your other breast. If he or she does not continue feeding - that is OK. Your baby will not necessarily need to feed from both breasts in a single feeding. On the next feeding, start  with the other breast for efficiency and comfort. How can I tell if my baby is hungry? When your baby is hungry, they will nuzzle against your breast, make sucking noises and tongue motions and may put their hands near their mouth. Crying is a late sign of hunger, so you should not wait until this point. When they have received enough milk, they will unlatch from the breast. Is it okay to use a pacifier? Until your baby gets the hang of breastfeeding, experts recommend limiting pacifier usage. If you have questions about this, please contact your pediatrician. What can I do to ensure proper  nutrition while breastfeeding? . Make sure that you support your own health and your baby's by eating a healthy, well-balanced diet . Your provider may recommend that you continue to take your prenatal vitamin . Drink plenty of fluids. It is a good rule to drink one glass of water before or after feeding . Alcohol will remain in the breast milk for as long as it will remain in the blood stream. If you choose to have a drink, it is recommended that you wait at least 2 hours before feeding . Moderate amounts of caffeine are OK . Some over-the-counter or prescription medications are not recommended during breastfeeding. Check with your provider if you have questions What types of birth control methods are safe while breastfeeding? Progestin-only methods, including a daily pill, an IUD, the implant and the injection are safe while breastfeeding. Methods that contain estrogen (such as combination birth control pills, the vaginal ring and the patch) should not be used during the first month of breastfeeding as these can decrease your milk supply.

## 2018-04-15 NOTE — Progress Notes (Signed)
HIGH-RISK PREGNANCY VISIT Patient name: Carla Schaefer MRN [id number]  Date of birth: 09/07/1995 Chief Complaint:   Vaginal Discharge  History of Present Illness:   Carla Schaefer is a 22 y.o. G29P0010 female at [redacted]w[redacted]d with an Estimated Date of Delivery: 05/26/18 being seen today for ongoing management of a high-risk pregnancy complicated by gestational DM, class  A1 DM.  Today she reports some hearturn, some vaginal discharge/irritation. Contractions: Not present. Vag. Bleeding: None.  Movement: Present. denies leaking of fluid.  Review of Systems:   Pertinent items are noted in HPI Denies abnormal vaginal discharge w/ itching/odor/irritation, headaches, visual changes, shortness of breath, chest pain, abdominal pain, severe nausea/vomiting, or problems with urination or bowel movements unless otherwise stated above.    Pertinent History Reviewed:  Medical & Surgical Hx:   Past Medical History:  Diagnosis Date  . ADHD   . Anxiety   . Chronic headaches   . UTI (lower urinary tract infection) 09/07/2014   4 in past 12 months. No cultures.  Culture 09/07/2014    Past Surgical History:  Procedure Laterality Date  . NO PAST SURGERIES     Family History  Problem Relation Age of Onset  . Cancer Maternal Grandmother   . COPD Mother   . Emphysema Mother   . Thyroid disease Mother   . Cancer Maternal Aunt   . Stroke Maternal Uncle     Current Outpatient Medications:  .  acetaminophen (TYLENOL) 500 MG tablet, Take 500 mg by mouth every 6 (six) hours as needed., Disp: , Rfl:  .  Prenatal MV-Min-FA-Omega-3 (PRENATAL GUMMIES/DHA & FA PO), Take by mouth. Takes 2 daily, Disp: , Rfl:  .  promethazine (PHENERGAN) 25 MG tablet, Take 0.5-1 tablets (12.5-25 mg total) by mouth every 6 (six) hours as needed for nausea or vomiting., Disp: 30 tablet, Rfl: 0 .  sertraline (ZOLOFT) 25 MG tablet, Take 1 tablet (25 mg total) by mouth daily., Disp: 30 tablet, Rfl: 6 Social History: Reviewed -  reports  that she has been smoking cigarettes. She has a 6.50 pack-year smoking history. She has never used smokeless tobacco.   Physical Assessment:   Vitals:   04/15/18 0958  BP: 129/75  Pulse: (!) 101  Weight: 147 lb (66.7 kg)  Body mass index is 28.71 kg/m.           Physical Examination:   General appearance: alert, well appearing, and in no distress  Mental status: alert, oriented to person, place, and time  Skin: warm & dry   Extremities: Edema: Trace    Cardiovascular: normal heart rate noted  Respiratory: normal respiratory effort, no distress  Abdomen: gravid, soft, non-tender  Pelvic: vulva irritated, small amount of yeast on wet prep/vagina. No pooling/fern neg         Fetal Status:     Movement: Present    Fetal Surveillance Testing today: doppler  Results for orders placed or performed in visit on 04/15/18 (from the past 24 hour(s))  POC Urinalysis Dipstick OB   Collection Time: 04/15/18  9:57 AM  Result Value Ref Range   Color, UA     Clarity, UA     Glucose, UA Negative Negative   Bilirubin, UA     Ketones, UA neg    Spec Grav, UA     Blood, UA neg    pH, UA     POC Protein UA Negative Negative, Trace   Urobilinogen, UA     Nitrite, UA neg  Leukocytes, UA Negative Negative   Appearance     Odor      Assessment & Plan:  1) High-risk pregnancy G2P0010 at [redacted]w[redacted]d with an Estimated Date of Delivery: 05/26/18   2) A1DM, Puppy ate log, hasn't been checking much ; stressed need for testing/tight control  To call me w/whatever results she has   3) RUQ pain, check GB US  4)  Yeast infection--7 day OTC tx  Labs/procedures today:   Treatment Plan:  Continue QID blood sugar testing, EFW 36-38 weeks, IOL 40 weeks   Follow-up: Return in about 2 weeks (around 04/29/2018) for HROB.  Orders Placed This Encounter  Procedures  . POC Urinalysis Dipstick OB   Jacklyn Shell CNM 04/15/2018 10:20 AM

## 2018-04-19 ENCOUNTER — Encounter: Payer: Medicaid Other | Admitting: Women's Health

## 2018-04-19 ENCOUNTER — Telehealth: Payer: Self-pay | Admitting: *Deleted

## 2018-04-19 ENCOUNTER — Ambulatory Visit (HOSPITAL_COMMUNITY)
Admission: RE | Admit: 2018-04-19 | Discharge: 2018-04-19 | Disposition: A | Discharge status: Home / Self Care | Payer: Medicaid Other | Source: Ambulatory Visit | Attending: Advanced Practice Midwife | Admitting: Advanced Practice Midwife

## 2018-04-19 DIAGNOSIS — O26893 Other specified pregnancy related conditions, third trimester: Secondary | ICD-10-CM | POA: Insufficient documentation

## 2018-04-19 DIAGNOSIS — O9989 Other specified diseases and conditions complicating pregnancy, childbirth and the puerperium: Secondary | ICD-10-CM | POA: Insufficient documentation

## 2018-04-19 DIAGNOSIS — O0993 Supervision of high risk pregnancy, unspecified, third trimester: Secondary | ICD-10-CM | POA: Diagnosis present

## 2018-04-19 DIAGNOSIS — Z3A34 34 weeks gestation of pregnancy: Secondary | ICD-10-CM | POA: Insufficient documentation

## 2018-04-19 DIAGNOSIS — R1011 Right upper quadrant pain: Secondary | ICD-10-CM | POA: Diagnosis not present

## 2018-04-19 DIAGNOSIS — N1330 Unspecified hydronephrosis: Secondary | ICD-10-CM | POA: Diagnosis not present

## 2018-04-19 NOTE — Telephone Encounter (Signed)
Patient informed normal u/s, no problems w/ gallbladder, some fluid around Rt kidney which is normal during pregnancy. Pt very upset stating she is still very uncomfortable, her "skin is burning".  States she was supposed to be prescribed something for heartburn but nothing has been sent in.  Can you send?

## 2018-04-21 ENCOUNTER — Other Ambulatory Visit: Payer: Self-pay | Admitting: Advanced Practice Midwife

## 2018-04-21 MED ORDER — OMEPRAZOLE 20 MG PO CPDR: 20.0000 mg | DELAYED_RELEASE_CAPSULE | Freq: Every day | ORAL | 6 refills | Status: DC

## 2018-04-21 NOTE — Telephone Encounter (Signed)
done

## 2018-04-21 NOTE — Progress Notes (Signed)
prilosec for heartburn 

## 2018-04-22 ENCOUNTER — Ambulatory Visit (HOSPITAL_COMMUNITY): Payer: Self-pay

## 2018-04-29 ENCOUNTER — Encounter: Payer: Medicaid Other | Admitting: Family Medicine

## 2018-04-29 ENCOUNTER — Other Ambulatory Visit: Payer: Self-pay | Admitting: Women's Health

## 2018-04-29 ENCOUNTER — Encounter: Payer: Self-pay | Admitting: Women's Health

## 2018-04-29 ENCOUNTER — Ambulatory Visit (INDEPENDENT_AMBULATORY_CARE_PROVIDER_SITE_OTHER): Payer: Medicaid Other | Admitting: Women's Health

## 2018-04-29 VITALS — BP 115/73 | HR 105 | Wt 148.3 lb

## 2018-04-29 DIAGNOSIS — Z1389 Encounter for screening for other disorder: Secondary | ICD-10-CM

## 2018-04-29 DIAGNOSIS — O0993 Supervision of high risk pregnancy, unspecified, third trimester: Secondary | ICD-10-CM

## 2018-04-29 DIAGNOSIS — Z331 Pregnant state, incidental: Secondary | ICD-10-CM

## 2018-04-29 DIAGNOSIS — O24415 Gestational diabetes mellitus in pregnancy, controlled by oral hypoglycemic drugs: Secondary | ICD-10-CM

## 2018-04-29 DIAGNOSIS — O24410 Gestational diabetes mellitus in pregnancy, diet controlled: Secondary | ICD-10-CM

## 2018-04-29 DIAGNOSIS — Z3A36 36 weeks gestation of pregnancy: Secondary | ICD-10-CM

## 2018-04-29 LAB — POCT URINALYSIS DIPSTICK OB
Blood, UA: NEGATIVE
Glucose, UA: NEGATIVE
KETONES UA: NEGATIVE
Leukocytes, UA: NEGATIVE
Nitrite, UA: NEGATIVE
POC,PROTEIN,UA: NEGATIVE

## 2018-04-29 NOTE — Progress Notes (Signed)
   HIGH-RISK PREGNANCY VISIT Patient name: Carla Schaefer MRN [id number]  Date of birth: 06-08-1996 Chief Complaint:   High Risk Gestation (a lot pressure)  History of Present Illness:   Carla Schaefer is a 22 y.o. G8P0010 female at [redacted]w[redacted]d with an Estimated Date of Delivery: 05/26/18 being seen today for ongoing management of a high-risk pregnancy complicated by A2DM- meds added today.  Today she reports FBS 88-118 but states she gets up during night and eats most nights; 2hr pp 90-145. Contractions: Not present.  .  Movement: Present. denies leaking of fluid.  Review of Systems:   Pertinent items are noted in HPI Denies abnormal vaginal discharge w/ itching/odor/irritation, headaches, visual changes, shortness of breath, chest pain, abdominal pain, severe nausea/vomiting, or problems with urination or bowel movements unless otherwise stated above. Pertinent History Reviewed:  Reviewed past medical,surgical, social, obstetrical and family history.  Reviewed problem list, medications and allergies. Physical Assessment:   Vitals:   04/29/18 1206  BP: 115/73  Pulse: (!) 105  Weight: 148 lb 4.8 oz (67.3 kg)  Body mass index is 28.96 kg/m.           Physical Examination:   General appearance: alert, well appearing, and in no distress  Mental status: alert, oriented to person, place, and time  Skin: warm & dry   Extremities: Edema: None    Cardiovascular: normal heart rate noted  Respiratory: normal respiratory effort, no distress  Abdomen: gravid, soft, non-tender  Pelvic: Cervical exam deferred         Fetal Status: Fetal Heart Rate (bpm): 140 Fundal Height: 36 cm Movement: Present Presentation: Vertex  Fetal Surveillance Testing today: doppler   Results for orders placed or performed in visit on 04/29/18 (from the past 24 hour(s))  POC Urinalysis Dipstick OB   Collection Time: 04/29/18 12:14 PM  Result Value Ref Range   Color, UA     Clarity, UA     Glucose, UA Negative  Negative   Bilirubin, UA     Ketones, UA neg    Spec Grav, UA     Blood, UA neg    pH, UA     POC Protein UA Negative Negative, Trace   Urobilinogen, UA     Nitrite, UA neg    Leukocytes, UA Negative Negative   Appearance     Odor      Assessment & Plan:  1) High-risk pregnancy G2P0010 at [redacted]w[redacted]d with an Estimated Date of Delivery: 05/26/18   2) A2DM, unstable, rx metformin 500mg  BID  3) Dep/anx, on zoloft  Meds: No orders of the defined types were placed in this encounter.   Labs/procedures today: none  Treatment Plan:  Growth u/s @  36-38wks     2x/wk testing    Deliver @ 39wks  Reviewed: Preterm labor symptoms and general obstetric precautions including but not limited to vaginal bleeding, contractions, leaking of fluid and fetal movement were reviewed in detail with the patient.  All questions were answered.  Follow-up: Return for mon for hrob/nst, thurs for hrob, efw/afi/bpp u/s; then mon/thur hrob/nst thru 11/18.  Orders Placed This Encounter  Procedures  . POC Urinalysis Dipstick OB   Cheral Marker CNM, [facility] 04/29/2018 1:54 PM

## 2018-04-29 NOTE — Patient Instructions (Signed)
Carla Schaefer, I greatly value your feedback.  If you receive a survey following your visit with [place] today, we appreciate you taking the time to fill it out.  Thanks, Joellyn Haff, CNM, WHNP-BC   Call the office [phone]) or go to [facility] if:  You begin to have strong, frequent contractions  Your water breaks.  Sometimes it is a big gush of fluid, sometimes it is just a trickle that keeps getting your panties wet or running down your legs  You have vaginal bleeding.  It is normal to have a small amount of spotting if your cervix was checked.   You don't feel your baby moving like normal.  If you don't, get you something to eat and drink and lay down and focus on feeling your baby move.  You should feel at least 10 movements in 2 hours.  If you don't, you should call the office or go to [facility].     Preterm Labor and Birth Information The normal length of a pregnancy is 39-41 weeks. Preterm labor is when labor starts before 37 completed weeks of pregnancy. What are the risk factors for preterm labor? Preterm labor is more likely to occur in women who:  Have certain infections during pregnancy such as a bladder infection, sexually transmitted infection, or infection inside the uterus (chorioamnionitis).  Have a shorter-than-normal cervix.  Have gone into preterm labor before.  Have had surgery on their cervix.  Are younger than age 4 or older than age 25.  Are African American.  Are pregnant with twins or multiple babies (multiple gestation).  Take street drugs or smoke while pregnant.  Do not gain enough weight while pregnant.  Became pregnant shortly after having been pregnant.  What are the symptoms of preterm labor? Symptoms of preterm labor include:  Cramps similar to those that can happen during a menstrual period. The cramps may happen with diarrhea.  Pain in the abdomen or lower back.  Regular uterine contractions that may feel like tightening  of the abdomen.  A feeling of increased pressure in the pelvis.  Increased watery or bloody mucus discharge from the vagina.  Water breaking (ruptured amniotic sac).  Why is it important to recognize signs of preterm labor? It is important to recognize signs of preterm labor because babies who are born prematurely may not be fully developed. This can put them at an increased risk for:  Long-term (chronic) heart and lung problems.  Difficulty immediately after birth with regulating body systems, including blood sugar, body temperature, heart rate, and breathing rate.  Bleeding in the brain.  Cerebral palsy.  Learning difficulties.  Death.  These risks are highest for babies who are born before 34 weeks of pregnancy. How is preterm labor treated? Treatment depends on the length of your pregnancy, your condition, and the health of your baby. It may involve:  Having a stitch (suture) placed in your cervix to prevent your cervix from opening too early (cerclage).  Taking or being given medicines, such as: ? Hormone medicines. These may be given early in pregnancy to help support the pregnancy. ? Medicine to stop contractions. ? Medicines to help mature the baby's lungs. These may be prescribed if the risk of delivery is high. ? Medicines to prevent your baby from developing cerebral palsy.  If the labor happens before 34 weeks of pregnancy, you may need to stay in the hospital. What should I do if I think I am in preterm labor? If  you think that you are going into preterm labor, call your health care provider right away. How can I prevent preterm labor in future pregnancies? To increase your chance of having a full-term pregnancy:  Do not use any tobacco products, such as cigarettes, chewing tobacco, and e-cigarettes. If you need help quitting, ask your health care provider.  Do not use street drugs or medicines that have not been prescribed to you during your pregnancy.  Talk  with your health care provider before taking any herbal supplements, even if you have been taking them regularly.  Make sure you gain a healthy amount of weight during your pregnancy.  Watch for infection. If you think that you might have an infection, get it checked right away.  Make sure to tell your health care provider if you have gone into preterm labor before.  This information is not intended to replace advice given to you by your health care provider. Make sure you discuss any questions you have with your health care provider. Document Released: 09/06/2003 Document Revised: 11/27/2015 Document Reviewed: 11/07/2015 Elsevier Interactive Patient Education  2018 [employer].

## 2018-05-03 ENCOUNTER — Encounter: Payer: Self-pay | Admitting: Women's Health

## 2018-05-03 ENCOUNTER — Other Ambulatory Visit: Payer: Medicaid Other

## 2018-05-03 ENCOUNTER — Ambulatory Visit (INDEPENDENT_AMBULATORY_CARE_PROVIDER_SITE_OTHER): Payer: Medicaid Other | Admitting: Women's Health

## 2018-05-03 VITALS — BP 116/72 | HR 72 | Wt 148.6 lb

## 2018-05-03 DIAGNOSIS — O0993 Supervision of high risk pregnancy, unspecified, third trimester: Secondary | ICD-10-CM

## 2018-05-03 DIAGNOSIS — O24415 Gestational diabetes mellitus in pregnancy, controlled by oral hypoglycemic drugs: Secondary | ICD-10-CM

## 2018-05-03 DIAGNOSIS — Z3A36 36 weeks gestation of pregnancy: Secondary | ICD-10-CM | POA: Diagnosis not present

## 2018-05-03 DIAGNOSIS — Z331 Pregnant state, incidental: Secondary | ICD-10-CM

## 2018-05-03 DIAGNOSIS — Z3483 Encounter for supervision of other normal pregnancy, third trimester: Secondary | ICD-10-CM

## 2018-05-03 DIAGNOSIS — O24419 Gestational diabetes mellitus in pregnancy, unspecified control: Secondary | ICD-10-CM

## 2018-05-03 DIAGNOSIS — Z1389 Encounter for screening for other disorder: Secondary | ICD-10-CM

## 2018-05-03 LAB — POCT URINALYSIS DIPSTICK OB
Blood, UA: NEGATIVE
Glucose, UA: NEGATIVE
LEUKOCYTES UA: NEGATIVE
NITRITE UA: NEGATIVE

## 2018-05-03 MED ORDER — METFORMIN HCL 500 MG PO TABS: 500.0000 mg | ORAL_TABLET | Freq: Two times a day (BID) | ORAL | 1 refills | Status: DC

## 2018-05-03 NOTE — Progress Notes (Signed)
   HIGH-RISK PREGNANCY VISIT Patient name: Carla Schaefer MRN [id number]  Date of birth: 06-11-1996 Chief Complaint:   High Risk Gestation (gbs/gc/chl)  History of Present Illness:   Carla Schaefer is a 22 y.o. G7P0010 female at [redacted]w[redacted]d with an Estimated Date of Delivery: 05/26/18 being seen today for ongoing management of a high-risk pregnancy complicated by A2DM rx'd metformin 500mg  BID last visit.  Today she reports hasn't picked up metformin yet. Hasn't been able to check sugars, monitor is reading E9. Contractions: Not present.  .  Movement: Present. denies leaking of fluid.  Review of Systems:   Pertinent items are noted in HPI Denies abnormal vaginal discharge w/ itching/odor/irritation, headaches, visual changes, shortness of breath, chest pain, abdominal pain, severe nausea/vomiting, or problems with urination or bowel movements unless otherwise stated above. Pertinent History Reviewed:  Reviewed past medical,surgical, social, obstetrical and family history.  Reviewed problem list, medications and allergies. Physical Assessment:   Vitals:   05/03/18 1121  BP: 116/72  Pulse: 72  Weight: 148 lb 9.6 oz (67.4 kg)  Body mass index is 29.02 kg/m.           Physical Examination:   General appearance: alert, well appearing, and in no distress  Mental status: alert, oriented to person, place, and time  Skin: warm & dry   Extremities: Edema: None    Cardiovascular: normal heart rate noted  Respiratory: normal respiratory effort, no distress  Abdomen: gravid, soft, non-tender  Pelvic: Cervical exam performed  Dilation: 1.5 Effacement (%): 50 Station: -2  Fetal Status: Fetal Heart Rate (bpm): 120 Fundal Height: 34 cm Movement: Present Presentation: Vertex  Fetal Surveillance Testing today: NST: FHR baseline 120 bpm, Variability: moderate, Accelerations:present, Decelerations:  Absent= Cat 1/Reactive Toco: irregular     Results for orders placed or performed in visit on 05/03/18  (from the past 24 hour(s))  POC Urinalysis Dipstick OB   Collection Time: 05/03/18 11:24 AM  Result Value Ref Range   Color, UA     Clarity, UA     Glucose, UA Negative Negative   Bilirubin, UA     Ketones, UA moderate    Spec Grav, UA     Blood, UA neg    pH, UA     POC Protein UA Trace Negative, Trace   Urobilinogen, UA     Nitrite, UA neg    Leukocytes, UA Negative Negative   Appearance     Odor      Assessment & Plan:  1) High-risk pregnancy G2P0010 at [redacted]w[redacted]d with an Estimated Date of Delivery: 05/26/18   2) A2DM, non-compliant, hasn't picked up metformin, reports monitor not working. To take monitor to [facility] today to fix/replace. Pick up metformin and start taking as directed  Meds: No orders of the defined types were placed in this encounter.  Labs/procedures today: gbs, gc/ct, sve  Treatment Plan:  Efw/afi/bpp u/s Thursday, 2x/wk nst, deliver @ 39wks  Reviewed: Preterm labor symptoms and general obstetric precautions including but not limited to vaginal bleeding, contractions, leaking of fluid and fetal movement were reviewed in detail with the patient.  All questions were answered.  Follow-up: Return for As scheduled Thurs for hrob, bpp/efw/afi u/s.  Orders Placed This Encounter  Procedures  . Strep Gp B NAA  . GC/Chlamydia Probe Amp  . US OB Follow Up  . US FETAL BPP WO NON STRESS  . POC Urinalysis Dipstick OB   Cheral Marker CNM, [facility] 05/03/2018 1:18 PM

## 2018-05-03 NOTE — Patient Instructions (Signed)
Carla Schaefer, I greatly value your feedback.  If you receive a survey following your visit with [place] today, we appreciate you taking the time to fill it out.  Thanks, Joellyn Haff, CNM, WHNP-BC   Call the office [phone]) or go to [facility] if:  You begin to have strong, frequent contractions  Your water breaks.  Sometimes it is a big gush of fluid, sometimes it is just a trickle that keeps getting your panties wet or running down your legs  You have vaginal bleeding.  It is normal to have a small amount of spotting if your cervix was checked.   You don't feel your baby moving like normal.  If you don't, get you something to eat and drink and lay down and focus on feeling your baby move.  You should feel at least 10 movements in 2 hours.  If you don't, you should call the office or go to [facility].     [facility] Contractions Contractions of the uterus can occur throughout pregnancy, but they are not always a sign that you are in labor. You may have practice contractions called Braxton Hicks contractions. These false labor contractions are sometimes confused with true labor. What are Deberah Pelton contractions? Braxton Hicks contractions are tightening movements that occur in the muscles of the uterus before labor. Unlike true labor contractions, these contractions do not result in opening (dilation) and thinning of the cervix. Toward the end of pregnancy (32-34 weeks), Braxton Hicks contractions can happen more often and may become stronger. These contractions are sometimes difficult to tell apart from true labor because they can be very uncomfortable. You should not feel embarrassed if you go to the hospital with false labor. Sometimes, the only way to tell if you are in true labor is for your health care provider to look for changes in the cervix. The health care provider will do a physical exam and may monitor your contractions. If you are not in true labor, the exam should  show that your cervix is not dilating and your water has not broken. If there are other health problems associated with your pregnancy, it is completely safe for you to be sent home with false labor. You may continue to have Braxton Hicks contractions until you go into true labor. How to tell the difference between true labor and false labor True labor  Contractions last 30-70 seconds.  Contractions become very regular.  Discomfort is usually felt in the top of the uterus, and it spreads to the lower abdomen and low back.  Contractions do not go away with walking.  Contractions usually become more intense and increase in frequency.  The cervix dilates and gets thinner. False labor  Contractions are usually shorter and not as strong as true labor contractions.  Contractions are usually irregular.  Contractions are often felt in the front of the lower abdomen and in the groin.  Contractions may go away when you walk around or change positions while lying down.  Contractions get weaker and are shorter-lasting as time goes on.  The cervix usually does not dilate or become thin. Follow these instructions at home:  Take over-the-counter and prescription medicines only as told by your health care provider.  Keep up with your usual exercises and follow other instructions from your health care provider.  Eat and drink lightly if you think you are going into labor.  If Braxton Hicks contractions are making you uncomfortable: ? Change your position from lying  down or resting to walking, or change from walking to resting. ? Sit and rest in a tub of warm water. ? Drink enough fluid to keep your urine pale yellow. Dehydration may cause these contractions. ? Do slow and deep breathing several times an hour.  Keep all follow-up prenatal visits as told by your health care provider. This is important. Contact a health care provider if:  You have a fever.  You have continuous pain in  your abdomen. Get help right away if:  Your contractions become stronger, more regular, and closer together.  You have fluid leaking or gushing from your vagina.  You pass blood-tinged mucus (bloody show).  You have bleeding from your vagina.  You have low back pain that you never had before.  You feel your baby's head pushing down and causing pelvic pressure.  Your baby is not moving inside you as much as it used to. Summary  Contractions that occur before labor are called Braxton Hicks contractions, false labor, or practice contractions.  Braxton Hicks contractions are usually shorter, weaker, farther apart, and less regular than true labor contractions. True labor contractions usually become progressively stronger and regular and they become more frequent.  Manage discomfort from [facility] contractions by changing position, resting in a warm bath, drinking plenty of water, or practicing deep breathing. This information is not intended to replace advice given to you by your health care provider. Make sure you discuss any questions you have with your health care provider. Document Released: 10/30/2016 Document Revised: 10/30/2016 Document Reviewed: 10/30/2016 Elsevier Interactive Patient Education  2018 [employer].

## 2018-05-03 NOTE — Addendum Note (Signed)
Addended by: Cheral Marker on: 05/03/2018 02:49 PM   Modules accepted: Orders

## 2018-05-05 LAB — GC/CHLAMYDIA PROBE AMP
CHLAMYDIA, DNA PROBE: NEGATIVE
Neisseria gonorrhoeae by PCR: NEGATIVE

## 2018-05-05 LAB — STREP GP B NAA: STREP GROUP B AG: NEGATIVE

## 2018-05-06 ENCOUNTER — Ambulatory Visit (INDEPENDENT_AMBULATORY_CARE_PROVIDER_SITE_OTHER): Payer: Medicaid Other | Admitting: Advanced Practice Midwife

## 2018-05-06 ENCOUNTER — Ambulatory Visit (INDEPENDENT_AMBULATORY_CARE_PROVIDER_SITE_OTHER): Payer: Medicaid Other

## 2018-05-06 VITALS — BP 116/73 | HR 89 | Wt 150.0 lb

## 2018-05-06 DIAGNOSIS — O0990 Supervision of high risk pregnancy, unspecified, unspecified trimester: Secondary | ICD-10-CM

## 2018-05-06 DIAGNOSIS — Z3A37 37 weeks gestation of pregnancy: Secondary | ICD-10-CM

## 2018-05-06 DIAGNOSIS — O24419 Gestational diabetes mellitus in pregnancy, unspecified control: Secondary | ICD-10-CM

## 2018-05-06 DIAGNOSIS — O0993 Supervision of high risk pregnancy, unspecified, third trimester: Secondary | ICD-10-CM

## 2018-05-06 DIAGNOSIS — Z1389 Encounter for screening for other disorder: Secondary | ICD-10-CM

## 2018-05-06 DIAGNOSIS — Z331 Pregnant state, incidental: Secondary | ICD-10-CM

## 2018-05-06 LAB — POCT URINALYSIS DIPSTICK OB
Blood, UA: NEGATIVE
Glucose, UA: NEGATIVE
Ketones, UA: NEGATIVE
NITRITE UA: NEGATIVE
PROTEIN: NEGATIVE

## 2018-05-06 NOTE — Progress Notes (Signed)
[place] 37+1 wks,cephalic,BPP 8/8,anterior placenta gr 3,afi 15 cm,fhr 164 bpm,efw 3321 g 74%

## 2018-05-06 NOTE — Patient Instructions (Signed)

## 2018-05-06 NOTE — Progress Notes (Signed)
HIGH-RISK PREGNANCY VISIT Patient name: Carla Schaefer MRN [id number]  Date of birth: February 06, 1996 Chief Complaint:   High Risk Gestation (u/s today)  History of Present Illness:   Carla Schaefer is a 22 y.o. G76P0010 female at [redacted]w[redacted]d with an Estimated Date of Delivery: 05/26/18 being seen today for ongoing management of a high-risk pregnancy complicated by gestational DM.  Today she reports no complaints. Contractions: Irregular. Vag. Bleeding: None.  Movement: Present. denies leaking of fluid.  Review of Systems:   Pertinent items are noted in HPI Denies abnormal vaginal discharge w/ itching/odor/irritation, headaches, visual changes, shortness of breath, chest pain, abdominal pain, severe nausea/vomiting, or problems with urination or bowel movements unless otherwise stated above.    Pertinent History Reviewed:  Medical & Surgical Hx:   Past Medical History:  Diagnosis Date  . ADHD   . Anxiety   . Chronic headaches   . UTI (lower urinary tract infection) 09/07/2014   4 in past 12 months. No cultures.  Culture 09/07/2014    Past Surgical History:  Procedure Laterality Date  . NO PAST SURGERIES     Family History  Problem Relation Age of Onset  . Cancer Maternal Grandmother   . COPD Mother   . Emphysema Mother   . Thyroid disease Mother   . Cancer Maternal Aunt   . Stroke Maternal Uncle     Current Outpatient Medications:  .  acetaminophen (TYLENOL) 500 MG tablet, Take 500 mg by mouth every 6 (six) hours as needed., Disp: , Rfl:  .  metFORMIN (GLUCOPHAGE) 500 MG tablet, Take 1 tablet (500 mg total) by mouth 2 (two) times daily with a meal., Disp: 60 tablet, Rfl: 1 .  Prenatal MV-Min-FA-Omega-3 (PRENATAL GUMMIES/DHA & FA PO), Take by mouth. Takes 2 daily, Disp: , Rfl:  .  promethazine (PHENERGAN) 25 MG tablet, Take 0.5-1 tablets (12.5-25 mg total) by mouth every 6 (six) hours as needed for nausea or vomiting., Disp: 30 tablet, Rfl: 0 .  omeprazole (PRILOSEC) 20 MG capsule, Take  1 capsule (20 mg total) by mouth daily. (Patient not taking: Reported on 05/06/2018), Disp: 30 capsule, Rfl: 6 .  sertraline (ZOLOFT) 25 MG tablet, Take 1 tablet (25 mg total) by mouth daily. (Patient not taking: Reported on 05/06/2018), Disp: 30 tablet, Rfl: 6 Social History: Reviewed -  reports that she has been smoking cigarettes. She has a 6.50 pack-year smoking history. She has never used smokeless tobacco.   Physical Assessment:   Vitals:   05/06/18 1155  BP: 116/73  Pulse: 89  Weight: 150 lb (68 kg)  Body mass index is 29.29 kg/m.           Physical Examination:   General appearance: alert, well appearing, and in no distress  Mental status: alert, oriented to person, place, and time  Skin: warm & dry   Extremities: Edema: None    Cardiovascular: normal heart rate noted  Respiratory: normal respiratory effort, no distress  Abdomen: gravid, soft, non-tender  Pelvic: Cervical exam deferred         Fetal Status:     Movement: Present    Fetal Surveillance Testing today: [place] 37+1 wks,cephalic,BPP 8/8,anterior placenta gr 3,afi 15 cm,fhr 164 bpm,efw 3321 g 74%  Results for orders placed or performed in visit on 05/06/18 (from the past 24 hour(s))  POC Urinalysis Dipstick OB   Collection Time: 05/06/18 11:57 AM  Result Value Ref Range   Color, UA     Clarity, UA  Glucose, UA Negative Negative   Bilirubin, UA     Ketones, UA neg    Spec Grav, UA     Blood, UA neg    pH, UA     POC,PROTEIN,UA Negative Negative, Trace   Urobilinogen, UA     Nitrite, UA neg    Leukocytes, UA Small (1+) (A) Negative   Appearance     Odor      Assessment & Plan:  1) High-risk pregnancy G2P0010 at [redacted]w[redacted]d with an Estimated Date of Delivery: 05/26/18   2) GDM, stable.  Treatment Plan:   2x/wk nst, deliver @ 40wks;  Pt never started meds, stopped the sweets and now all BS are normal. Admitted that even high "fastings" were after drinking chocolate milk in themiddle of the night)   Follow-up:  Return for As scheduled.  Orders Placed This Encounter  Procedures  . POC Urinalysis Dipstick OB   Jacklyn Shell CNM 05/06/2018 12:26 PM

## 2018-05-09 ENCOUNTER — Encounter (HOSPITAL_COMMUNITY): Payer: Self-pay

## 2018-05-09 ENCOUNTER — Inpatient Hospital Stay (HOSPITAL_COMMUNITY): Payer: Medicaid Other | Admitting: Anesthesiology

## 2018-05-09 ENCOUNTER — Inpatient Hospital Stay (HOSPITAL_COMMUNITY)
Admission: AD | Admit: 2018-05-09 | Discharge: 2018-05-12 | DRG: 805 | Disposition: A | Discharge status: Home / Self Care | Payer: Medicaid Other | Attending: Obstetrics and Gynecology | Admitting: Obstetrics and Gynecology

## 2018-05-09 ENCOUNTER — Other Ambulatory Visit: Payer: Self-pay

## 2018-05-09 DIAGNOSIS — O4290 Premature rupture of membranes, unspecified as to length of time between rupture and onset of labor, unspecified weeks of gestation: Secondary | ICD-10-CM | POA: Diagnosis present

## 2018-05-09 DIAGNOSIS — O99344 Other mental disorders complicating childbirth: Secondary | ICD-10-CM | POA: Diagnosis present

## 2018-05-09 DIAGNOSIS — F419 Anxiety disorder, unspecified: Secondary | ICD-10-CM | POA: Diagnosis present

## 2018-05-09 DIAGNOSIS — O24420 Gestational diabetes mellitus in childbirth, diet controlled: Secondary | ICD-10-CM | POA: Diagnosis present

## 2018-05-09 DIAGNOSIS — O411230 Chorioamnionitis, third trimester, not applicable or unspecified: Secondary | ICD-10-CM | POA: Diagnosis present

## 2018-05-09 DIAGNOSIS — F17210 Nicotine dependence, cigarettes, uncomplicated: Secondary | ICD-10-CM | POA: Diagnosis present

## 2018-05-09 DIAGNOSIS — O0990 Supervision of high risk pregnancy, unspecified, unspecified trimester: Secondary | ICD-10-CM

## 2018-05-09 DIAGNOSIS — Z3A37 37 weeks gestation of pregnancy: Secondary | ICD-10-CM

## 2018-05-09 DIAGNOSIS — F329 Major depressive disorder, single episode, unspecified: Secondary | ICD-10-CM | POA: Diagnosis present

## 2018-05-09 DIAGNOSIS — O99334 Smoking (tobacco) complicating childbirth: Secondary | ICD-10-CM | POA: Diagnosis present

## 2018-05-09 DIAGNOSIS — Z3483 Encounter for supervision of other normal pregnancy, third trimester: Secondary | ICD-10-CM | POA: Diagnosis present

## 2018-05-09 LAB — POCT FERN TEST: POCT FERN TEST: POSITIVE

## 2018-05-09 LAB — CBC
HCT: 33 % — ABNORMAL LOW (ref 36.0–46.0)
HEMOGLOBIN: 11.1 g/dL — AB (ref 12.0–15.0)
MCH: 30.4 pg (ref 26.0–34.0)
MCHC: 33.6 g/dL (ref 30.0–36.0)
MCV: 90.4 fL (ref 80.0–100.0)
PLATELETS: 275 10*3/uL (ref 150–400)
RBC: 3.65 MIL/uL — ABNORMAL LOW (ref 3.87–5.11)
RDW: 13.2 % (ref 11.5–15.5)
WBC: 19.6 10*3/uL — ABNORMAL HIGH (ref 4.0–10.5)
nRBC: 0 % (ref 0.0–0.2)

## 2018-05-09 LAB — TYPE AND SCREEN
ABO/RH(D): A POS
Antibody Screen: NEGATIVE

## 2018-05-09 LAB — GLUCOSE, CAPILLARY: Glucose-Capillary: 84 mg/dL (ref 70–99)

## 2018-05-09 LAB — ABO/RH: ABO/RH(D): A POS

## 2018-05-09 MED ORDER — OXYTOCIN 40 UNITS IN LACTATED RINGERS INFUSION - SIMPLE MED
2.5000 [IU]/h | INTRAVENOUS | Status: DC
  Administered 2018-05-10: 2.5 [IU]/h via INTRAVENOUS

## 2018-05-09 MED ORDER — LACTATED RINGERS IV BOLUS
1000.0000 mL | Freq: Once | INTRAVENOUS | Status: AC
  Administered 2018-05-09: 1000 mL via INTRAVENOUS

## 2018-05-09 MED ORDER — LACTATED RINGERS IV SOLN
INTRAVENOUS | Status: DC
  Administered 2018-05-09 – 2018-05-10 (×4): via INTRAVENOUS

## 2018-05-09 MED ORDER — LACTATED RINGERS IV SOLN: 500.0000 mL | Freq: Once | INTRAVENOUS | Status: DC

## 2018-05-09 MED ORDER — BENZONATATE 100 MG PO CAPS
100.0000 mg | ORAL_CAPSULE | Freq: Three times a day (TID) | ORAL | Status: DC | PRN
  Administered 2018-05-09 – 2018-05-10 (×2): 100 mg via ORAL
  Filled 2018-05-09 (×3): qty 1

## 2018-05-09 MED ORDER — LACTATED RINGERS IV SOLN
500.0000 mL | INTRAVENOUS | Status: DC | PRN
  Administered 2018-05-10 (×2): 500 mL via INTRAVENOUS

## 2018-05-09 MED ORDER — FLEET ENEMA 7-19 GM/118ML RE ENEM: 1.0000 | ENEMA | RECTAL | Status: DC | PRN

## 2018-05-09 MED ORDER — LIDOCAINE HCL (PF) 1 % IJ SOLN
INTRAMUSCULAR | Status: DC | PRN
  Administered 2018-05-09 (×2): 4 mL via EPIDURAL

## 2018-05-09 MED ORDER — DM-GUAIFENESIN ER 30-600 MG PO TB12
1.0000 | ORAL_TABLET | Freq: Two times a day (BID) | ORAL | Status: DC
  Administered 2018-05-09 – 2018-05-10 (×2): 1 via ORAL
  Filled 2018-05-09 (×4): qty 1

## 2018-05-09 MED ORDER — PHENYLEPHRINE 40 MCG/ML (10ML) SYRINGE FOR IV PUSH (FOR BLOOD PRESSURE SUPPORT)
80.0000 ug | PREFILLED_SYRINGE | INTRAVENOUS | Status: DC | PRN
  Filled 2018-05-09: qty 10
  Filled 2018-05-09: qty 5
  Filled 2018-05-09: qty 10

## 2018-05-09 MED ORDER — FENTANYL CITRATE (PF) 100 MCG/2ML IJ SOLN
100.0000 ug | Freq: Once | INTRAMUSCULAR | Status: AC
  Administered 2018-05-09: 100 ug via INTRAVENOUS
  Filled 2018-05-09: qty 2

## 2018-05-09 MED ORDER — EPHEDRINE 5 MG/ML INJ
10.0000 mg | INTRAVENOUS | Status: DC | PRN
  Filled 2018-05-09: qty 2

## 2018-05-09 MED ORDER — PHENYLEPHRINE 40 MCG/ML (10ML) SYRINGE FOR IV PUSH (FOR BLOOD PRESSURE SUPPORT)
80.0000 ug | PREFILLED_SYRINGE | INTRAVENOUS | Status: DC | PRN
  Filled 2018-05-09: qty 5

## 2018-05-09 MED ORDER — DIPHENHYDRAMINE HCL 50 MG/ML IJ SOLN: 12.5000 mg | INTRAMUSCULAR | Status: DC | PRN

## 2018-05-09 MED ORDER — OXYCODONE-ACETAMINOPHEN 5-325 MG PO TABS
1.0000 | ORAL_TABLET | ORAL | Status: DC | PRN
  Administered 2018-05-11: 1 via ORAL
  Filled 2018-05-09: qty 1

## 2018-05-09 MED ORDER — OXYTOCIN BOLUS FROM INFUSION
500.0000 mL | Freq: Once | INTRAVENOUS | Status: AC
  Administered 2018-05-10: 500 mL via INTRAVENOUS

## 2018-05-09 MED ORDER — NALBUPHINE HCL 10 MG/ML IJ SOLN
10.0000 mg | Freq: Once | INTRAMUSCULAR | Status: AC
  Administered 2018-05-09: 10 mg via INTRAVENOUS
  Filled 2018-05-09: qty 1

## 2018-05-09 MED ORDER — OXYCODONE-ACETAMINOPHEN 5-325 MG PO TABS: 2.0000 | ORAL_TABLET | ORAL | Status: DC | PRN

## 2018-05-09 MED ORDER — NICOTINE 21 MG/24HR TD PT24
21.0000 mg | MEDICATED_PATCH | Freq: Every day | TRANSDERMAL | Status: DC
  Administered 2018-05-09 – 2018-05-10 (×2): 21 mg via TRANSDERMAL
  Filled 2018-05-09 (×3): qty 1

## 2018-05-09 MED ORDER — ACETAMINOPHEN 325 MG PO TABS
650.0000 mg | ORAL_TABLET | ORAL | Status: DC | PRN
  Administered 2018-05-10 (×2): 650 mg via ORAL
  Filled 2018-05-09 (×2): qty 2

## 2018-05-09 MED ORDER — FENTANYL 2.5 MCG/ML BUPIVACAINE 1/10 % EPIDURAL INFUSION (WH - ANES)
14.0000 mL/h | INTRAMUSCULAR | Status: DC | PRN
  Administered 2018-05-09: 13 mL/h via EPIDURAL
  Administered 2018-05-10 (×4): 14 mL/h via EPIDURAL
  Filled 2018-05-09 (×5): qty 100

## 2018-05-09 MED ORDER — SOD CITRATE-CITRIC ACID 500-334 MG/5ML PO SOLN
30.0000 mL | ORAL | Status: DC | PRN
  Administered 2018-05-09 – 2018-05-10 (×3): 30 mL via ORAL
  Filled 2018-05-09 (×3): qty 15

## 2018-05-09 MED ORDER — LIDOCAINE HCL (PF) 1 % IJ SOLN
30.0000 mL | INTRAMUSCULAR | Status: DC | PRN
  Administered 2018-05-10: 30 mL via SUBCUTANEOUS
  Filled 2018-05-09: qty 30

## 2018-05-09 MED ORDER — ONDANSETRON HCL 4 MG/2ML IJ SOLN
4.0000 mg | Freq: Four times a day (QID) | INTRAMUSCULAR | Status: DC | PRN
  Administered 2018-05-09 – 2018-05-10 (×2): 4 mg via INTRAVENOUS
  Filled 2018-05-09 (×2): qty 2

## 2018-05-09 NOTE — MAU Note (Signed)
Reports ctx 2-3 min apart  Had some ? Leaking this AM, states it was clear and it was only once  No bleeding

## 2018-05-09 NOTE — H&P (Signed)
Carla Schaefer is a 22 y.o. female G2P0010 @[redacted]w[redacted]d  by LMP c/w 8 week [place] presenting for active labor with SROM in MAU at 1840 on 05/09/18.  She presented with painful regular contractions and leakage of fluid but testing for ROM was negative and cervix was 3 cm but with SROM was admitted for labor.  Fetal movement is normal. She has GDM and was ordered Metformin but changed her diet and blood glucose then wnl so is considered A1DM.  EFW 74%tile at 37 weeks.  Hx significant for depression anxiety/on Zoloft.     FAMILY TREE  LAB RESULTS  Language English Pap 10/07/16: neg  Initiated care at 9wks GC/CT Initial:  -/-          36wks:  -/-  Dating by LMP c/w 8wk u/s    Support person [facility] NT/IT:  neg      AFP:         cfDNA:    [place]/HgbE   Flu vaccine 04/05/18  CF declined  TDaP vaccine 04/05/18   SMA   Rhogam       Blood Type A/Positive/-- (04/29 1154)  Anatomy US Normal female Antibody Negative (04/29 1154)  Feeding Plan both HBsAg Negative (04/29 1154)  Contraception condoms RPR Non Reactive (04/29 1154)  Circumcision Yes, at FT Rubella  1.34 (04/29 1154)  Pediatrician [facility] HIV Non Reactive (04/29 1154)  Prenatal Classes Info given      GTT/A1C Early:      26-28wks:  97/194/139  BTL Consent n/a GBS   neg     [ ]  PCN allergy  VBAC Consent n/a    Waterbirth [ ] Class [ ] Consent [ ] CNM visit PP Needs       OB History    Gravida  2   Para      Term      Preterm      AB  1   Living        SAB  0   TAB  1   Ectopic      Multiple      Live Births             Past Medical History:  Diagnosis Date  . ADHD   . Anxiety   . Chronic headaches   . UTI (lower urinary tract infection) 09/07/2014   4 in past 12 months. No cultures.  Culture 09/07/2014    Past Surgical History:  Procedure Laterality Date  . NO PAST SURGERIES     Family History: family history includes COPD in her mother; Cancer in her maternal aunt and maternal grandmother; Emphysema in her  mother; Stroke in her maternal uncle; Thyroid disease in her mother. Social History:  reports that she has been smoking cigarettes. She has a 6.50 pack-year smoking history. She has never used smokeless tobacco. She reports that she drank alcohol. She reports that she has current or past drug history. Drug: Marijuana.     Maternal Diabetes: Yes:  Diabetes Type:  Diet controlled Genetic Screening: Normal Maternal Ultrasounds/Referrals: Normal Fetal Ultrasounds or other Referrals:  None Maternal Substance Abuse:  Yes:  Type: Smoker, Marijuana Significant Maternal Medications:  None Significant Maternal Lab Results:  Lab values include: Group B Strep negative Other Comments:  None  Review of Systems  Constitutional: Negative for chills and fever.  Respiratory: Negative for shortness of breath.   Cardiovascular: Negative for chest pain.  Gastrointestinal: Positive for abdominal pain. Negative for constipation, diarrhea and vomiting.  Neurological: Negative for dizziness and headaches.  All other systems reviewed and are negative.  Maternal Medical History:  Reason for admission: Rupture of membranes and contractions.   Contractions: Onset was 3-5 hours ago.   Frequency: regular.   Duration is approximately 1 minute.   Perceived severity is moderate.    Fetal activity: Perceived fetal activity is normal.   Last perceived fetal movement was within the past hour.    Prenatal complications: no prenatal complications Prenatal Complications - Diabetes: gestational. Diabetes is managed by diet.      Dilation: 3 Effacement (%): 80 Station: -2 Exam by:: Marvel Plan RN  Blood pressure 126/77, pulse (!) 109, temperature 98.1 F (36.7 C), temperature source Oral, resp. rate 20, weight 68 kg, last menstrual period 08/19/2017. Maternal Exam:  Uterine Assessment: Contraction strength is moderate.  Contraction frequency is regular.   Abdomen: Fetal presentation: vertex  Introitus:  Ferning test: positive.   Cervix: Cervix evaluated by digital exam.     Fetal Exam Fetal Monitor Review: Mode: ultrasound.   Baseline rate: 135.  Variability: moderate (6-25 bpm).   Pattern: accelerations present and no decelerations.    Fetal State Assessment: Category I - tracings are normal.     Physical Exam  Nursing note and vitals reviewed. Constitutional: She is oriented to person, place, and time. She appears well-developed and well-nourished.  Neck: Normal range of motion.  Cardiovascular: Normal rate and regular rhythm.  Respiratory: Effort normal and breath sounds normal.  GI: Soft.  Musculoskeletal: Normal range of motion.  Neurological: She is alert and oriented to person, place, and time.  Skin: Skin is warm and dry.  Psychiatric: She has a normal mood and affect. Her behavior is normal. Judgment and thought content normal.    Prenatal labs: ABO, Rh: --/--/A POS (11/10 1856) Antibody: NEG (11/10 1856) Rubella: 1.34 (04/29 1154) RPR: Non Reactive (09/09 0850)  HBsAg: Negative (04/29 1154)  HIV: Non Reactive (09/09 0850)  GBS: Negative (11/04 1532)   Assessment/Plan: 22 y.o. G2P0010 @[redacted]w[redacted]d  presents with SROM and active labor GBS negative  Admit to [facility] Intake CBG May have epidural when desired Anticipate NSVD   Sharen Counter 05/09/2018, 7:52 PM

## 2018-05-09 NOTE — Anesthesia Preprocedure Evaluation (Signed)
Anesthesia Evaluation  Patient identified by MRN, date of birth, ID band Patient awake    Reviewed: Allergy & Precautions, Patient's Chart, lab work & pertinent test results  History of Anesthesia Complications Negative for: history of anesthetic complications  Airway Mallampati: II  TM Distance: >3 FB Neck ROM: Full    Dental no notable dental hx. (+) Partial Upper, Poor Dentition   Pulmonary Current Smoker,    Pulmonary exam normal breath sounds clear to auscultation       Cardiovascular negative cardio ROS Normal cardiovascular exam Rhythm:Regular Rate:Normal     Neuro/Psych  Headaches, Anxiety Depression negative psych ROS   GI/Hepatic negative GI ROS, Neg liver ROS,   Endo/Other  diabetes, Gestational  Renal/GU negative Renal ROS  negative genitourinary   Musculoskeletal negative musculoskeletal ROS (+)   Abdominal   Peds negative pediatric ROS (+)  Hematology negative hematology ROS (+)   Anesthesia Other Findings   Reproductive/Obstetrics (+) Pregnancy                             Anesthesia Physical Anesthesia Plan  ASA: II  Anesthesia Plan: Epidural   Post-op Pain Management:    Induction:   PONV Risk Score and Plan: 2 and Treatment may vary due to age or medical condition  Airway Management Planned: Natural Airway  Additional Equipment:   Intra-op Plan:   Post-operative Plan:   Informed Consent: I have reviewed the patients History and Physical, chart, labs and discussed the procedure including the risks, benefits and alternatives for the proposed anesthesia with the patient or authorized representative who has indicated his/her understanding and acceptance.     Plan Discussed with: CRNA  Anesthesia Plan Comments:         Anesthesia Quick Evaluation

## 2018-05-09 NOTE — Anesthesia Procedure Notes (Signed)
Epidural Patient location during procedure: OB Start time: 05/09/2018 7:45 PM End time: 05/09/2018 7:49 PM  Staffing Anesthesiologist: Kaylyn Layer, MD Performed: anesthesiologist   Preanesthetic Checklist Completed: patient identified, pre-op evaluation, timeout performed, IV checked, risks and benefits discussed and monitors and equipment checked  Epidural Patient position: sitting Prep: site prepped and draped and DuraPrep Patient monitoring: continuous pulse ox, blood pressure, heart rate and cardiac monitor Approach: midline Location: L3-L4 Injection technique: LOR air  Needle:  Needle type: Tuohy  Needle gauge: 17 G Needle length: 9 cm Needle insertion depth: 6 cm Catheter type: closed end flexible Catheter size: 19 Gauge Catheter at skin depth: 11 cm Test dose: negative and Other (1% lidocaine)  Assessment Events: blood not aspirated, injection not painful, no injection resistance, negative IV test and no paresthesia  Additional Notes Patient identified. Risks, benefits, and alternatives discussed with patient including but not limited to bleeding, infection, nerve damage, paralysis, failed block, incomplete pain control, headache, blood pressure changes, nausea, vomiting, reactions to medication, itching, and postpartum back pain. Confirmed with bedside nurse the patient's most recent platelet count. Confirmed with patient that they are not currently taking any anticoagulation, have any bleeding history, or any family history of bleeding disorders. Patient expressed understanding and wished to proceed. All questions were answered. Sterile technique was used throughout the entire procedure. Please see nursing notes for vital signs. LOR on first pass. Test dose was given through epidural catheter and negative prior to continuing to dose epidural or start infusion. Warning signs of high block given to the patient including shortness of breath, tingling/numbness in hands,  complete motor block, or any concerning symptoms with instructions to call for help. Patient was given instructions on fall risk and not to get out of bed. All questions and concerns addressed with instructions to call with any issues or inadequate analgesia.  Reason for block:procedure for pain

## 2018-05-09 NOTE — Anesthesia Pain Management Evaluation Note (Signed)
  CRNA Pain Management Visit Note  Patient: Carla Schaefer, 22 y.o., female  "Hello I am a member of the anesthesia team at [facility]. We have an anesthesia team available at all times to provide care throughout the hospital, including epidural management and anesthesia for C-section. I don't know your plan for the delivery whether it a natural birth, water birth, IV sedation, nitrous supplementation, doula or epidural, but we want to meet your pain goals."   1.Was your pain managed to your expectations on prior hospitalizations?   No prior hospitalizations  2.What is your expectation for pain management during this hospitalization?     Epidural  3.How can we help you reach that goal? Epidural in place and working well  Record the patient's initial score and the patient's pain goal.   Pain: 0  Pain Goal: 4 [facility] wants you to be able to say your pain was always managed very well.  Carla Schaefer 05/09/2018

## 2018-05-09 NOTE — MAU Provider Note (Signed)
Chief Complaint  Patient presents with  . Contractions  . Rupture of Membranes     First Provider Initiated Contact with Patient 05/09/18 1406      S: Carla Schaefer  is a 22 y.o. y.o. year old G31P0010 female at [redacted]w[redacted]d weeks gestation who presents to MAU reporting frequent contractions and leaking of clear fluid since this morning.   Contractions: every 2-3 minutes  Vaginal bleeding: denies Fetal movement: Nml    FAMILY TREE  LAB RESULTS  Language English Pap 10/07/16: neg  Initiated care at 9wks GC/CT Initial:  -/-          36wks:  -/-  Dating by LMP c/w 8wk u/s    Support person [facility] NT/IT:  neg      AFP:         cfDNA:    [place]/HgbE   Flu vaccine 04/05/18  CF declined  TDaP vaccine 04/05/18   SMA   Rhogam       Blood Type A/Positive/-- (04/29 1154)  Anatomy US Normal female Antibody Negative (04/29 1154)  Feeding Plan both HBsAg Negative (04/29 1154)  Contraception condoms RPR Non Reactive (04/29 1154)  Circumcision Yes, at FT Rubella  1.34 (04/29 1154)  Pediatrician [facility] HIV Non Reactive (04/29 1154)  Prenatal Classes Info given      GTT/A1C Early:      26-28wks:  97/194/139  BTL Consent n/a GBS   neg     [ ]  PCN allergy  VBAC Consent n/a    Waterbirth [ ] Class [ ] Consent [ ] CNM visit PP Needs       Patient Active Problem List   Diagnosis Date Noted  . Gestational diabetes mellitus, class A1 (never took meds) 03/11/2018  . Umbilical hernia 03/08/2018  . Supervision of high risk pregnancy, antepartum 10/26/2017  . Marijuana use 10/26/2017  . Smoker 10/26/2017  . Depression with anxiety 10/26/2017  . Pelvic pain in female 03/21/2015   Ultrasound 05/06/2018: Estimated fetal weight 3300 g, 74th percentile  O:  Patient Vitals for the past 24 hrs:  BP Temp Temp src Pulse Resp Weight  05/09/18 1322 126/77 98.1 F (36.7 C) Oral (!) 109 20 68 kg   General: NAD Heart: Regular rate Lungs: Normal rate and effort Abd: Soft, NT, Gravid, S=D Pelvic: NEFG,  Neg pooling, no blood, large amount of mucus.  Dilation: 2 Effacement (%): Thick Cervical Position: Posterior Station: -2 Presentation: Vertex Exam by:: Marvel Plan RN   EFM: 140, Moderate variability, 15 x 15 accelerations, no decelerations Toco: Q1.5-3, mod-strong   Neg Fern  Orders Placed This Encounter  Procedures  . POCT fern test  . Insert peripheral IV   Meds ordered this encounter  Medications  . lactated ringers bolus 1,000 mL  . fentaNYL (SUBLIMAZE) injection 100 mcg  . nalbuphine (NUBAIN) injection 10 mg   No relief of pain with fentanyl and fluid bolus.  Minimal cervical change.  Now 3/80/-3.  Will order Nubain and reevaluate cervix in 1 hour.  Minimal pain relief with Nubain.  Pain still 10/10.  Patient not coping well.  CNM explained that we do not have medical indication for augmenting labor at 37 weeks and 4 days.  Patient then coughed and felt a gush of fluid.  Pad was wet.  Fern slide positive.  Requesting to smoke a cigarette.   A: [redacted]w[redacted]d week IUP SROM at 1840.  Early labor GBS neg  Assessment: 1. Labor: Early, SROM 2. Fetal Wellbeing: Category  I  3. Pain Control: No pain relief with fentanyl and Nubain. 4. GBS: Negative 5.  37.4 week IUP  Plan:  1. Admit to BS per consult with MD 2. Routine L&D orders 3. Analgesia/anesthesia PRN  4.  Nicotine patch   Katrinka Blazing, [place] 05/09/2018 6:55 PM  2

## 2018-05-10 ENCOUNTER — Other Ambulatory Visit: Payer: Medicaid Other | Admitting: Obstetrics & Gynecology

## 2018-05-10 ENCOUNTER — Other Ambulatory Visit: Payer: Medicaid Other

## 2018-05-10 LAB — GLUCOSE, CAPILLARY
GLUCOSE-CAPILLARY: 93 mg/dL (ref 70–99)
Glucose-Capillary: 101 mg/dL — ABNORMAL HIGH (ref 70–99)
Glucose-Capillary: 133 mg/dL — ABNORMAL HIGH (ref 70–99)
Glucose-Capillary: 84 mg/dL (ref 70–99)
Glucose-Capillary: 89 mg/dL (ref 70–99)
Glucose-Capillary: 93 mg/dL (ref 70–99)

## 2018-05-10 LAB — RPR: RPR Ser Ql: NONREACTIVE

## 2018-05-10 MED ORDER — FENTANYL CITRATE (PF) 100 MCG/2ML IJ SOLN
INTRAMUSCULAR | Status: AC
  Filled 2018-05-10: qty 2

## 2018-05-10 MED ORDER — TERBUTALINE SULFATE 1 MG/ML IJ SOLN
0.2500 mg | Freq: Once | INTRAMUSCULAR | Status: DC | PRN
  Filled 2018-05-10: qty 1

## 2018-05-10 MED ORDER — GENTAMICIN SULFATE 40 MG/ML IJ SOLN
120.0000 mg | Freq: Three times a day (TID) | INTRAVENOUS | Status: DC
  Administered 2018-05-10: 120 mg via INTRAVENOUS
  Filled 2018-05-10 (×4): qty 3

## 2018-05-10 MED ORDER — SODIUM CHLORIDE 0.9 % IV SOLN
2.0000 g | Freq: Four times a day (QID) | INTRAVENOUS | Status: DC
  Administered 2018-05-10 (×2): 2 g via INTRAVENOUS
  Filled 2018-05-10 (×2): qty 2
  Filled 2018-05-10 (×3): qty 2000

## 2018-05-10 MED ORDER — SODIUM BICARBONATE 8.4 % IV SOLN
INTRAVENOUS | Status: DC | PRN
  Administered 2018-05-10 (×3): 5 mL via EPIDURAL

## 2018-05-10 MED ORDER — OXYTOCIN 40 UNITS IN LACTATED RINGERS INFUSION - SIMPLE MED
1.0000 m[IU]/min | INTRAVENOUS | Status: DC
  Administered 2018-05-10: 12 m[IU]/min via INTRAVENOUS
  Administered 2018-05-10: 2 m[IU]/min via INTRAVENOUS
  Filled 2018-05-10 (×2): qty 1000

## 2018-05-10 MED ORDER — LIDOCAINE-EPINEPHRINE (PF) 2 %-1:200000 IJ SOLN
INTRAMUSCULAR | Status: DC | PRN
  Administered 2018-05-10: 5 mL via EPIDURAL

## 2018-05-10 MED ORDER — ACETAMINOPHEN 160 MG/5ML PO SOLN
650.0000 mg | ORAL | Status: DC | PRN
  Administered 2018-05-10 – 2018-05-11 (×2): 650 mg via ORAL
  Filled 2018-05-10 (×2): qty 20.3

## 2018-05-10 MED ORDER — FENTANYL CITRATE (PF) 100 MCG/2ML IJ SOLN
INTRAMUSCULAR | Status: DC | PRN
  Administered 2018-05-10: 100 ug via EPIDURAL

## 2018-05-10 MED ORDER — SODIUM BICARBONATE 8.4 % IV SOLN: INTRAVENOUS | Status: DC | PRN

## 2018-05-10 NOTE — Progress Notes (Signed)
LABOR PROGRESS NOTE  Carla Schaefer is a 22 y.o. G2P0010 at [redacted]w[redacted]d  admitted for SOL/SROM.   Subjective: Doing well, continues to endorse some back pain with contractions.  Objective: BP 114/76   Pulse 91   Temp 98.6 F (37 C) (Oral)   Resp 18   Ht 5' (1.524 m)   Wt 68 kg   LMP 08/19/2017 (Approximate)   SpO2 97%   BMI 29.29 kg/m  or  Vitals:   05/10/18 0800 05/10/18 0830 05/10/18 0900 05/10/18 0930  BP: (!) 98/39 (!) 94/41 113/61 114/76  Pulse: 98 93 93 91  Resp: 18 18  18   Temp:  98.6 F (37 C)    TempSrc:  Oral    SpO2:      Weight:      Height:        Dilation: 4 Effacement (%): 80 Cervical Position: Posterior Station: -1, -2 Presentation: Vertex Exam by:: Welford Roche, RNC FHT: baseline rate 140, moderate varibility, +acel, -decel Toco: every 2 min   Labs: Lab Results  Component Value Date   WBC 19.6 (H) 05/09/2018   HGB 11.1 (L) 05/09/2018   HCT 33.0 (L) 05/09/2018   MCV 90.4 05/09/2018   PLT 275 05/09/2018    Patient Active Problem List   Diagnosis Date Noted  . Leakage of amniotic fluid 05/09/2018  . Gestational diabetes mellitus, class A1 (never took meds) 03/11/2018  . Umbilical hernia 03/08/2018  . Supervision of high risk pregnancy, antepartum 10/26/2017  . Marijuana use 10/26/2017  . Smoker 10/26/2017  . Depression with anxiety 10/26/2017  . Pelvic pain in female 03/21/2015    Assessment / Plan: 22 y.o. G2P0010 at [redacted]w[redacted]d here for SOL/SROM.   Labor: IOL with pit, currently at 10 contracting every 2 min. IUPC in place, may readjust/flush, MVU's 150-190s.  Fetal Wellbeing:  Cat 1 strip  Pain Control:  Epidural  Anticipated MOD:  NSVD   1. A1GDM: Stable, last CBG 93.   -CBG q4 during latent labor   Leticia Penna, D.O. Family Medicine PGY-1  05/10/2018, 10:51 AM

## 2018-05-10 NOTE — Progress Notes (Signed)
LABOR PROGRESS NOTE  Carla Schaefer is a 22 y.o. G2P0010 at [redacted]w[redacted]d  admitted for SROM.   Subjective: Doing well, having lower back pains.   Objective: BP 113/66   Pulse 99   Temp 99.1 F (37.3 C) (Oral)   Resp 18   Ht 5' (1.524 m)   Wt 68 kg   LMP 08/19/2017 (Approximate)   SpO2 97%   BMI 29.29 kg/m  or  Vitals:   05/10/18 1331 05/10/18 1401 05/10/18 1419 05/10/18 1431  BP: 113/68 107/64  113/66  Pulse: (!) 104 96  99  Resp:    18  Temp:   99.1 F (37.3 C)   TempSrc:   Oral   SpO2:      Weight:      Height:        Dilation: 5 Effacement (%): 80 Cervical Position: Posterior Station: -1 Presentation: Vertex Exam by:: dr Annia Friendly FHT: baseline rate 150, minimal varibility, few acel (1 in 20), -decel Toco: Every 1-2 min   Labs: Lab Results  Component Value Date   WBC 19.6 (H) 05/09/2018   HGB 11.1 (L) 05/09/2018   HCT 33.0 (L) 05/09/2018   MCV 90.4 05/09/2018   PLT 275 05/09/2018    Patient Active Problem List   Diagnosis Date Noted  . Leakage of amniotic fluid 05/09/2018  . Gestational diabetes mellitus, class A1 (never took meds) 03/11/2018  . Umbilical hernia 03/08/2018  . Supervision of high risk pregnancy, antepartum 10/26/2017  . Marijuana use 10/26/2017  . Smoker 10/26/2017  . Depression with anxiety 10/26/2017  . Pelvic pain in female 03/21/2015    Assessment / Plan: 22 y.o. G2P0010 at [redacted]w[redacted]d here for SROM, now with chorioamnionitis.  Labor: IOL with pit, now at 14. Felt additional bag of fluid during cervical check, ROM @ 1430, light mec. Repositioned IUPC. Consideration for pit break shortly, continues with inadequate MVU's with contractions every 1-2 min (minimal to no break in between).   Fetal Wellbeing:  Minimal variability, will watch closely.   Pain Control:  Epidural  Anticipated MOD:  Hopeful for NSVD   1.  Chorioamnionitis: Febrile to 102.9 at 12:59pm, with fetal tachycardia. Started on Amp and [place].   -Monitor fever curve,  FHTs  -Cont Abx therapy  -NICU team to be present at delivery   2. A1GDM: Last CBG 130, however had several apple juice cups prior to.  -Monitor CBG q4 during latent labor    Leticia Penna, D.O. Family Medicine PGY-1   05/10/2018, 2:36 PM

## 2018-05-10 NOTE — Progress Notes (Signed)
OB/GYN Faculty Practice: Labor Progress Note  Subjective: Mildly uncomfortable due to rib pain. Having trouble sleeping even with the Epidural.   Objective: BP 117/67   Pulse (!) 112   Temp 98.7 F (37.1 C) (Oral)   Resp 18   Ht 5' (1.524 m)   Wt 68 kg   LMP 08/19/2017 (Approximate)   SpO2 97%   BMI 29.29 kg/m  Gen: mild distress Dilation: 3 Effacement (%): 80 Cervical Position: Posterior Station: -2 Presentation: Vertex Exam by:: Dr. Darin Engels  Assessment and Plan: 22 y.o. G2P0010 [redacted]w[redacted]d admitted for SROM and active labor.   Labor: no cervical change after 6 hours, IUPC placed -- continue pitocin  -- pain control: Epidural in place  Fetal Well-Being: EFW 74%tile at 37 weeks. Cephalic by sutures -- Category 1 - continuous fetal monitoring  -- GBS negative  A1 vs A2 gDM - most recent CBG 101 -- q4hr CBGs  Burman Nieves, MD Family Medicine Resident 7:03 AM

## 2018-05-10 NOTE — Progress Notes (Signed)
OB/GYN Faculty Practice: Tracing Note  Objective: BP 124/70   Pulse 83   Temp 99 F (37.2 C) (Oral)   Resp 16   Ht 5' (1.524 m)   Wt 68 kg   LMP 08/19/2017 (Approximate)   SpO2 97%   BMI 29.29 kg/m   Dilation: 7 Effacement (%): 90 Cervical Position: Posterior Station: -1 Presentation: Vertex Exam by:: J.Thornton, RNC  FHR tracing: baseline HR 150, moderate variability, 15x15 accels, no decels  IUPC: contractions ever 2-4 minutes, adequate   Assessment and Plan: 22 y.o. G2P0010 [redacted]w[redacted]d here for SROM, now with chorioamnionitis.   Labor: continue pitocin  -- pain control: Epidural in place  Fetal Well-Being:  -- Category 1 - continuous fetal monitoring  -- GBS negative  Chorioamnionitis: Febrile to 102.9 at 12:59pm with fetal tachycardia. Afebrile on most recent check  - continue to treat with Ampicillin and Gentamycin - NICU team to be present at delivery   A1GDM: Last CBG 89, monitor CBG q4 during latent labor    Burman Nieves, MD Family Medicine Resident 8:54 PM

## 2018-05-10 NOTE — Progress Notes (Signed)
.  [facility] Pharm[facility] Antibiotic Note  Carla Schaefer is a 22 y.o. female admitted on 05/09/2018 for SOL/SROM.Marland Kitchen  Pharmacy has been consulted for gentamicin dosing for chorioamnionitis.  Plan: Start gentamicin 120mg  IV q 8hrs. (goal pk=6.5 mcg/ml, trough=0.76 mcg/ml)  Height: 5' (152.4 cm) Weight: 150 lb (68 kg) IBW/kg (Calculated) : 45.5  Temp (24hrs), Avg:99.3 F (37.4 C), Min:98.1 F (36.7 C), Max:102.9 F (39.4 C)  Recent Labs  Lab 05/09/18 1856  WBC 19.6*    CrCl cannot be calculated (Patient's most recent lab result is older than the maximum 21 days allowed.).    Allergies  Allergen Reactions  . Compazine [Prochlorperazine Edisylate]     Nausea and vomiting  . Latex     Antimicrobials this admission: Ampicillin 2gms IV q6hr    Thank you for allowing pharmacy to be a part of this patient's care.  Sherrilyn Rist 05/10/2018 1:20 PM

## 2018-05-10 NOTE — Progress Notes (Signed)
OB/GYN Faculty Practice: Labor Progress Note  Subjective: Doing well. Epidural working effectively.   Objective: BP 120/74   Pulse (!) 105   Temp 99.3 F (37.4 C) (Oral)   Resp 18   Ht 5' (1.524 m)   Wt 68 kg   LMP 08/19/2017 (Approximate)   SpO2 97%   BMI 29.29 kg/m  Gen: no distress Dilation: 3 Effacement (%): 80 Cervical Position: Posterior Station: -2 Presentation: Vertex Exam by:: Lanier Ensign RN  Assessment and Plan: 22 y.o. G2P0010 [redacted]w[redacted]d admitted for SROM and active labor.   Labor: no cervical change after 4 hours -- starting pitocin 2x2 -- pain control: Epidural in place  Fetal Well-Being: EFW 74%tile at 37 weeks. Cephalic by sutures -- Category 1 - continuous fetal monitoring  -- GBS negative  A1 vs A2 gDM - most recent CBG 93 -- q4hr CBGs  Burman Nieves, MD Family Medicine Resident 12:32 AM

## 2018-05-11 ENCOUNTER — Encounter (HOSPITAL_COMMUNITY): Payer: Self-pay

## 2018-05-11 ENCOUNTER — Telehealth: Payer: Self-pay | Admitting: *Deleted

## 2018-05-11 DIAGNOSIS — Z3A37 37 weeks gestation of pregnancy: Secondary | ICD-10-CM

## 2018-05-11 DIAGNOSIS — O24420 Gestational diabetes mellitus in childbirth, diet controlled: Secondary | ICD-10-CM

## 2018-05-11 MED ORDER — ZOLPIDEM TARTRATE 5 MG PO TABS: 5.0000 mg | ORAL_TABLET | Freq: Every evening | ORAL | Status: DC | PRN

## 2018-05-11 MED ORDER — SENNOSIDES-DOCUSATE SODIUM 8.6-50 MG PO TABS
2.0000 | ORAL_TABLET | ORAL | Status: DC
  Administered 2018-05-11: 2 via ORAL
  Filled 2018-05-11: qty 2

## 2018-05-11 MED ORDER — TETANUS-DIPHTH-ACELL PERTUSSIS 5-2.5-18.5 LF-MCG/0.5 IM SUSP: 0.5000 mL | Freq: Once | INTRAMUSCULAR | Status: DC

## 2018-05-11 MED ORDER — IBUPROFEN 600 MG PO TABS
600.0000 mg | ORAL_TABLET | Freq: Four times a day (QID) | ORAL | Status: DC
  Administered 2018-05-11 – 2018-05-12 (×7): 600 mg via ORAL
  Filled 2018-05-11 (×7): qty 1

## 2018-05-11 MED ORDER — BENZOCAINE-MENTHOL 20-0.5 % EX AERO
1.0000 "application " | INHALATION_SPRAY | CUTANEOUS | Status: DC | PRN
  Filled 2018-05-11: qty 56

## 2018-05-11 MED ORDER — WITCH HAZEL-GLYCERIN EX PADS: 1.0000 "application " | MEDICATED_PAD | CUTANEOUS | Status: DC | PRN

## 2018-05-11 MED ORDER — OXYCODONE-ACETAMINOPHEN 5-325 MG PO TABS
1.0000 | ORAL_TABLET | ORAL | Status: DC | PRN
  Administered 2018-05-11 – 2018-05-12 (×3): 1 via ORAL
  Filled 2018-05-11 (×3): qty 1

## 2018-05-11 MED ORDER — PRENATAL MULTIVITAMIN CH
1.0000 | ORAL_TABLET | Freq: Every day | ORAL | Status: DC
  Administered 2018-05-11 – 2018-05-12 (×2): 1 via ORAL
  Filled 2018-05-11 (×2): qty 1

## 2018-05-11 MED ORDER — ACETAMINOPHEN 325 MG PO TABS
650.0000 mg | ORAL_TABLET | ORAL | Status: DC | PRN
  Administered 2018-05-12: 650 mg via ORAL
  Filled 2018-05-11: qty 2

## 2018-05-11 MED ORDER — SIMETHICONE 80 MG PO CHEW: 80.0000 mg | CHEWABLE_TABLET | ORAL | Status: DC | PRN

## 2018-05-11 MED ORDER — COCONUT OIL OIL: 1.0000 "application " | TOPICAL_OIL | Status: DC | PRN

## 2018-05-11 MED ORDER — POLYETHYLENE GLYCOL 3350 17 G PO PACK
17.0000 g | PACK | Freq: Every day | ORAL | Status: DC
  Filled 2018-05-11 (×3): qty 1

## 2018-05-11 MED ORDER — HYDROMORPHONE HCL 1 MG/ML IJ SOLN
1.0000 mg | Freq: Once | INTRAMUSCULAR | Status: DC
  Filled 2018-05-11: qty 1

## 2018-05-11 MED ORDER — ONDANSETRON HCL 4 MG PO TABS: 4.0000 mg | ORAL_TABLET | ORAL | Status: DC | PRN

## 2018-05-11 MED ORDER — NICOTINE 14 MG/24HR TD PT24
14.0000 mg | MEDICATED_PATCH | Freq: Every day | TRANSDERMAL | Status: DC
  Filled 2018-05-11 (×3): qty 1

## 2018-05-11 MED ORDER — DIPHENHYDRAMINE HCL 25 MG PO CAPS: 25.0000 mg | ORAL_CAPSULE | Freq: Four times a day (QID) | ORAL | Status: DC | PRN

## 2018-05-11 MED ORDER — ONDANSETRON HCL 4 MG/2ML IJ SOLN: 4.0000 mg | INTRAMUSCULAR | Status: DC | PRN

## 2018-05-11 MED ORDER — DIBUCAINE 1 % RE OINT
1.0000 "application " | TOPICAL_OINTMENT | RECTAL | Status: DC | PRN
  Filled 2018-05-11: qty 28

## 2018-05-11 NOTE — Progress Notes (Signed)
POSTPARTUM PROGRESS NOTE  Post Partum Day 1  Subjective:  Carla Schaefer is a 22 y.o. G2P1011 s/p SVD at 7479w5d c/b chorioamnionitis (treated with ampicillin + gentamicin).  She reports she is doing well. No acute events overnight. She denies any problems with ambulating, voiding or po intake. Denies nausea or vomiting. Passing gas. No BMs. Pain is well controlled. Primarily experiencing pain at her laceration site that was repaired with suture during delivery and diffuse abdominal cramping.  Lochia is slowing.   Pt reports that she wants to smoke a cigarette. She is not interested in a nicotine replacement patch at this time.   Objective: Blood pressure (!) 97/57, pulse 71, temperature 97.8 F (36.6 C), temperature source Axillary, resp. rate 18, height 5' (1.524 m), weight 68 kg, last menstrual period 08/19/2017, SpO2 97 %, unknown if currently breastfeeding.  Physical Exam:  General: alert, cooperative and no distress Chest: no respiratory distress Heart:regular rate, distal pulses intact Abdomen: soft, TTP diffusely throughout abdomen Uterine Fundus: firm, appropriately tender DVT Evaluation: No calf swelling or tenderness Extremities: No LE edema Skin: warm, dry  Recent Labs    05/09/18 1856  HGB 11.1*  HCT 33.0*    Assessment/Plan: Carla Schaefer is a 22 y.o. G2P1011 s/p SVD at 5779w5d c/b chorioamnionitis (treated with amp+gent).   PPD#1 - Doing well.  -- Routine postpartum care  -- Continue to offer pt nicotine replacement therapy while inpatient  Contraception: Condoms Feeding: Breast and bottle Dispo: Plan for discharge tomorrow   LOS: 2 days   Raul DelFrancie Jenkins, MS3 05/11/2018, 7:32 AM   OB FELLOW POSTPARTUM PROGRESS NOTE ATTESTATION  I have seen and examined this patient and agree with above documentation in the student's note.   Gwenevere AbbotNimeka Catie Chiao, MD OB Fellow  05/12/2018, 8:14 PM

## 2018-05-11 NOTE — Anesthesia Postprocedure Evaluation (Signed)
Anesthesia Post Note  Patient: Carla Schaefer  Procedure(s) Performed: AN AD HOC LABOR EPIDURAL     Patient location during evaluation: Mother Baby Anesthesia Type: Epidural Level of consciousness: awake and alert, oriented and patient cooperative Pain management: pain level controlled Vital Signs Assessment: post-procedure vital signs reviewed and stable Respiratory status: spontaneous breathing Cardiovascular status: stable Postop Assessment: no headache, epidural receding, patient able to bend at knees and no signs of nausea or vomiting Anesthetic complications: no Comments: Pain score 8.      Last Vitals:  Vitals:   05/11/18 0250 05/11/18 0630  BP: (!) 97/57 (!) 93/53  Pulse: 71 64  Resp: 18 18  Temp: 36.6 C 36.6 C  SpO2: 97% 96%    Last Pain:  Vitals:   05/11/18 0630  TempSrc: Axillary  PainSc: Asleep   Pain Goal: Patients Stated Pain Goal: 0 (05/09/18 1530)               Merrilyn Puma

## 2018-05-11 NOTE — Telephone Encounter (Signed)
Tried to call pt to set up pp appointment but her vm is not set up.  05-11-18  AS

## 2018-05-11 NOTE — Lactation Note (Signed)
This note was copied from a baby's chart. Lactation Consultation Note Baby 4 hrs. Old. [facility] that she wants to formula tonight, will BF tomorow after she rest. Mom doesn't want to be bothered tonight.  Noted mom later walking in hall w/a little girl.  Patient Name: Boy Raye SorrowSamantha Mcqueary ZOXWR'UToday's Date: 05/11/2018     Maternal Data    Feeding Feeding Type: Bottle Fed - Formula  LATCH Score                   Interventions    Lactation Tools Discussed/Used     Consult Status      Denisa Enterline G 05/11/2018, 4:02 AM

## 2018-05-11 NOTE — Lactation Note (Signed)
This note was copied from a baby's chart. Lactation Consultation Note  Patient Name: Boy Raye SorrowSamantha Cuddeback WGNFA'OToday's Date: 05/11/2018    Visited with mom, but [facility] because she has gave baby a bottle. Reviewed supplementation guidelines for baby's age; mom was unaware that she had the handout in her folder. Asked mom to call for assistance when she's ready to put baby to the breast; so far baby is being fed 100% formula, he's on Gerber. Cigarette smell in room is very noticeable, RN noted that mom is constantly going out of her room.   Maternal Data    Feeding      Interventions    Lactation Tools Discussed/Used     Consult Status      Phineas Mcenroe Venetia ConstableS Leannah Guse 05/11/2018, 4:37 PM

## 2018-05-11 NOTE — Progress Notes (Signed)
CLINICAL SOCIAL WORK MATERNAL/CHILD NOTE  Patient Details  Name: Carla Schaefer MRN: [id number] Date of Birth: 05/10/2020  Date:  05/11/2018  Clinical Social Worker Initiating Note:  Simran Bomkamp, LCSWA Date/Time: Initiated:  05/11/18/1100     Child's Name:  Carla Schaefer   Biological Parents:  Mother, Father   Need for Interpreter:  None   Reason for Referral:  Current Substance Use/Substance Use During Pregnancy    Address:  [address] [place] [address]    Phone number:  [phone] (home)     Additional phone number:  Household Members/Support Persons (HM/SP):   Household Member/Support Person 1, Household Member/Support Person 2   HM/SP Name Relationship DOB or Age  HM/SP -1   MOB mother    HM/SP -2   FOB    HM/SP -3        HM/SP -4        HM/SP -5        HM/SP -6        HM/SP -7        HM/SP -8          Natural Supports (not living in the home):  Extended Family, Friends   Professional Supports: None   Employment: Unemployed   Type of Work:     Education:  Other (comment)(MOB reported 8th Grade as highest level of education completed)   Homebound arranged: No  Financial Resources:  Medicaid   Other Resources:  WIC, Food Stamps    Cultural/Religious Considerations Which May Impact Care:    Strengths:  Ability to meet basic needs , Home prepared for child , Pediatrician chosen   Psychotropic Medications:         Pediatrician:    [place]  Pediatrician List:   [place]    [place]    [place]    [place] [facility]  [place]    [place]      Pediatrician Fax Number:    Risk Factors/Current Problems:      Cognitive State:  Alert , Able to Concentrate , Linear Thinking    Mood/Affect:  Calm , Interested    CSW Assessment: CSW spoke with MOB at bedside regarding consult for "hx anxiety and ADHD, THC use in pregnancy". FOB present, MOB provided CSW with verbal  permission to complete assessment with FOB present. MOB reported that she resides with her mother and FOB. MOB reported that they are moving after discharge to [address] [place], [place]. MOB reported that they have all the necessary items to care for baby and that baby has a necessary in there new home. CSW inquired about MOB substance use during pregnancy, MOB reported that she use Marijuana during her pregnancy to help with sleeping and her appetite. MOB reported that her last use of marijuana was 4 days ago. CSW informed MOB about the hospital drug policy and that CSW would make a CPS report due to infants positive UDS for THC, MOB verbalized understanding. CSW inquired about MOB's mental health hx, MOB reported that she has been diagnosed with Anxiety and that she was taking Zoloft. MOB reported that she stopped taking Zoloft because it was making her sleepy and that she has not been having any anxiety symptoms. MOB presented calm and did not endorse any current mental health symptoms. MOB did not demonstrate any acute mental health signs or symptoms. CSW assessed for safety, MOB denied SI and HI.  CSW provided education regarding the baby   blues period vs. perinatal mood disorders, discussed treatment and gave resources for mental health follow up if concerns arise.  CSW recommends self-evaluation during the postpartum time period using the New Mom Checklist from Postpartum Progress and encouraged MOB to contact a medical professional if symptoms are noted at any time.    CSW provided review of Sudden Infant Death Syndrome (SIDS) precautions.    CSW identifies no further need for intervention and no barriers to discharge at this time.   CSW contacted [place] DSS to make a CPS report for positive UDS for THC. CSW made report to CPS worker (Jennifer Watkins [phone]).   CSW Plan/Description:  CSW Will Continue to Monitor Umbilical Cord Tissue Drug Screen Results and Make Report  if Warranted, Sudden Infant Death Syndrome (SIDS) Education, Perinatal Mood and Anxiety Disorder (PMADs) Education, No Further Intervention Required/No Barriers to Discharge, Child Protective Service Report     Yamilette Garretson L Becker Christopher, LCSW 05/11/2018, 11:04 AM  

## 2018-05-12 ENCOUNTER — Telehealth: Payer: Self-pay | Admitting: *Deleted

## 2018-05-12 MED ORDER — ALUM & MAG HYDROXIDE-SIMETH 200-200-20 MG/5ML PO SUSP
15.0000 mL | ORAL | Status: DC | PRN
  Administered 2018-05-12: 15 mL via ORAL
  Filled 2018-05-12 (×2): qty 30

## 2018-05-12 MED ORDER — IBUPROFEN 600 MG PO TABS: 600.0000 mg | ORAL_TABLET | Freq: Four times a day (QID) | ORAL | 0 refills | Status: DC

## 2018-05-12 NOTE — Discharge Summary (Signed)
Obstetrics Discharge Summary OB/GYN Faculty Practice   Patient Name: Carla Schaefer DOB: 10/24/1995 MRN: [id number]  Date of admission: 05/09/2018 Delivering MD: Gwenevere Abbot   Date of discharge: 05/12/2018  Admitting diagnosis: LEAKING Intrauterine pregnancy: [redacted]w[redacted]d     Secondary diagnosis:   Active Problems:   Leakage of amniotic fluid  Additional problems:  . A1GDM     Discharge diagnosis: Term Pregnancy Delivered                                            Postpartum procedures: None  Complications: chorioamnionitis  Hospital course: SABAH ZUCCO is a 22 y.o. [redacted]w[redacted]d who was admitted for SROM. Her pregnancy was complicated by A1GDM and depression/anxiety. Her labor course was notable for development of chorioamnionitis which was treated with IV ampicillin and gentamycin. Delivery was complicated by 2nd degree laceration which was repaired. Please see delivery/op note for additional details. Her postpartum course was uncomplicated. She was breastfeeding without difficulty. By day of discharge, she was passing flatus, urinating, eating and drinking without difficulty. Her pain was well-controlled, and she was discharged home with ibuprofen. She will follow-up in clinic in 4 weeks.   Physical exam  Vitals:   05/11/18 0900 05/11/18 1445 05/11/18 2309 05/12/18 0530  BP: (!) 98/54 102/69 (!) 97/58 110/69  Pulse: 67 78 80 83  Resp: 16 15  14   Temp:  (!) 97.5 F (36.4 C) (!) 97.5 F (36.4 C) (!) 97.3 F (36.3 C)  TempSrc:  Oral Oral Oral  SpO2: 99% 99% 99% 97%  Weight:      Height:       General: well appearing  Lochia: appropriate Uterine Fundus: firm Incision: N/A DVT Evaluation: No evidence of DVT seen on physical exam. Negative Homan's sign. Labs: Lab Results  Component Value Date   WBC 19.6 (H) 05/09/2018   HGB 11.1 (L) 05/09/2018   HCT 33.0 (L) 05/09/2018   MCV 90.4 05/09/2018   PLT 275 05/09/2018   CMP Latest Ref Rng & Units 01/03/2016  Glucose 65 - 99  mg/dL 409(W)  BUN 6 - 20 mg/dL 10  Creatinine 1.19 - 1.47 mg/dL 8.29  Sodium 562 - 130 mmol/L 138  Potassium 3.5 - 5.1 mmol/L 3.8  Chloride 101 - 111 mmol/L 106  CO2 22 - 32 mmol/L 27  Calcium 8.9 - 10.3 mg/dL 9.1  Total Protein 6.5 - 8.1 g/dL 7.5  Total Bilirubin 0.3 - 1.2 mg/dL 0.4  Alkaline Phos 38 - 126 U/L 47  AST 15 - 41 U/L 16  ALT 14 - 54 U/L 28    Discharge instructions: Per After Visit Summary and "Baby and Me Booklet"  After visit meds:  Allergies as of 05/12/2018      Reactions   Compazine [prochlorperazine Edisylate]    Nausea and vomiting   Latex       Medication List    STOP taking these medications   ALKA-SELTZER ANTACID PO   metFORMIN 500 MG tablet Commonly known as:  GLUCOPHAGE   omeprazole 20 MG capsule Commonly known as:  PRILOSEC   PRENATAL GUMMIES/DHA & FA PO   promethazine 25 MG tablet Commonly known as:  PHENERGAN   TYLENOL COLD/FLU SEVERE DAY PO     TAKE these medications   acetaminophen 500 MG tablet Commonly known as:  TYLENOL Take 1,000 mg by mouth every 8 (eight) hours  as needed for mild pain or headache.   ibuprofen 600 MG tablet Commonly known as:  ADVIL,MOTRIN Take 1 tablet (600 mg total) by mouth every 6 (six) hours.   sertraline 25 MG tablet Commonly known as:  ZOLOFT Take 1 tablet (25 mg total) by mouth daily.       Postpartum contraception: Condoms Diet: Routine Diet Activity: Advance as tolerated. Pelvic rest for 6 weeks.   Outpatient follow up:4 weeks Follow-up Appt:No future appointments. Follow-up Visit:No follow-ups on file.  Newborn Data: Live born female  Birth Weight: 6 lb 15.3 oz (3155 g) APGAR: 8, 9  Newborn Delivery   Birth date/time:  05/10/2018 23:25:00 Delivery type:  Vaginal, Spontaneous     Baby Feeding: Breast Disposition:rooming in  Burman NievesJulia Abraham, MD Family Medicine Resident  OB FELLOW DISCHARGE ATTESTATION  I have seen and examined this patient and agree with above documentation  in the resident's note.   Gwenevere AbbotNimeka Okema Rollinson, MD OB Fellow  05/12/2018, 8:16 PM

## 2018-05-12 NOTE — Lactation Note (Signed)
This note was copied from a baby's chart. Lactation Consultation Note  Patient Name: Carla Schaefer ZOXWR'UToday's Date: 05/12/2018 Reason for consult: Follow-up assessment;1st time breastfeeding;Early term 37-38.6wks 29 hour female infant, ETI. Per dad, been rough few days. Per mom, she attempted BF at 3 am but infant would not latch, she did not ask for assistance from Nurse or LC. Gave infant formula. Per mom, she will try latching him again tomorrow she is very tired. Pefers to use hand pump that  she was given earlier, will use her own personal DEBP when she gets home doesn't want use DEBP in hospital at this time.  LC encouraged mom to ask for assistance with latching infant to breast from Nurse or LC at next feeding. Per mom, she still wants to BF.  Maternal Data    Feeding Feeding Type: Breast Fed  LATCH Score Latch: Repeated attempts needed to sustain latch, nipple held in mouth throughout feeding, stimulation needed to elicit sucking reflex.  Audible Swallowing: None  Type of Nipple: Everted at rest and after stimulation  Comfort (Breast/Nipple): Soft / non-tender  Hold (Positioning): Assistance needed to correctly position infant at breast and maintain latch.  LATCH Score: 6  Interventions Interventions: Assisted with latch;Breast feeding basics reviewed;Breast massage;Hand express;Adjust position;Support pillows;Position options;Hand pump  Lactation Tools Discussed/Used     Consult Status      Danelle EarthlyRobin Leydi Winstead 05/12/2018, 4:28 AM

## 2018-05-12 NOTE — Discharge Instructions (Signed)

## 2018-05-12 NOTE — Telephone Encounter (Signed)
Tried to call pt to set up pp appointment but no vm set up.  05-12-18  AS

## 2018-05-13 ENCOUNTER — Encounter: Payer: Self-pay | Admitting: *Deleted

## 2018-05-13 ENCOUNTER — Other Ambulatory Visit: Payer: Medicaid Other

## 2018-05-13 ENCOUNTER — Telehealth: Payer: Self-pay | Admitting: *Deleted

## 2018-05-13 ENCOUNTER — Other Ambulatory Visit: Payer: Medicaid Other | Admitting: Advanced Practice Midwife

## 2018-05-13 ENCOUNTER — Encounter: Payer: Medicaid Other | Admitting: Women's Health

## 2018-05-13 NOTE — Telephone Encounter (Signed)
Tried to call pt to set up pp appointment but vm not set up.  Mailed letter to pt.  05-13-18  AS

## 2018-05-17 ENCOUNTER — Other Ambulatory Visit: Payer: Medicaid Other | Admitting: Obstetrics & Gynecology

## 2018-05-24 ENCOUNTER — Telehealth: Payer: Self-pay | Admitting: Women's Health

## 2018-05-24 ENCOUNTER — Ambulatory Visit (INDEPENDENT_AMBULATORY_CARE_PROVIDER_SITE_OTHER): Payer: Medicaid Other | Admitting: Women's Health

## 2018-05-24 ENCOUNTER — Encounter: Payer: Self-pay | Admitting: Women's Health

## 2018-05-24 VITALS — BP 126/90 | HR 80 | Temp 98.3°F | Ht 60.0 in | Wt 131.0 lb

## 2018-05-24 DIAGNOSIS — N760 Acute vaginitis: Secondary | ICD-10-CM | POA: Diagnosis not present

## 2018-05-24 DIAGNOSIS — B9689 Other specified bacterial agents as the cause of diseases classified elsewhere: Secondary | ICD-10-CM

## 2018-05-24 DIAGNOSIS — N898 Other specified noninflammatory disorders of vagina: Secondary | ICD-10-CM | POA: Diagnosis not present

## 2018-05-24 MED ORDER — SITZ BATH MISC: 0 refills | Status: DC

## 2018-05-24 MED ORDER — METRONIDAZOLE 500 MG PO TABS: 500.0000 mg | ORAL_TABLET | Freq: Two times a day (BID) | ORAL | 0 refills | Status: DC

## 2018-05-24 NOTE — Telephone Encounter (Signed)
Spoke with pt. Pt had vaginal delivery 11/11. Has dissolvable stitches. Feels like she has an infection. Has an odor. No fever that she knows of. Advised she needs to be seen. Call transferred to front desk for appt. JSY

## 2018-05-24 NOTE — Telephone Encounter (Signed)
Patient called, she delivered on 05/10/18 and she thinks her stitches may be infected.  She stated that she has stopped bleeding, has a little brown on the pad and has a faint odor.  [phone]

## 2018-05-24 NOTE — Progress Notes (Signed)
   GYN VISIT Patient name: Carla Schaefer MRN [id number]  Date of birth: 01/03/1996 Chief Complaint:   work-in-gyn (check stitches/ odor)  History of Present Illness:   Carla Schaefer is a 22 y.o. 512P1011 Caucasian female 13d s/p SVB being seen today for report of vaginal odor and pain where stitches are from 2nd degree perineal lac x 2-3 days. 'Something is wrong down there'. Some itching/irritation. Lochia normal.  Has not had sex since giving birth.     No LMP recorded. Review of Systems:   Pertinent items are noted in HPI Denies fever/chills, dizziness, headaches, visual disturbances, fatigue, shortness of breath, chest pain, abdominal pain, vomiting, abnormal vaginal discharge/itching/odor/irritation, problems with periods, bowel movements, urination, or intercourse unless otherwise stated above.  Pertinent History Reviewed:  Reviewed past medical,surgical, social, obstetrical and family history.  Reviewed problem list, medications and allergies. Physical Assessment:   Vitals:   05/24/18 1545  BP: 126/90  Pulse: 80  Temp: 98.3 F (36.8 C)  Weight: 131 lb (59.4 kg)  Height: 5' (1.524 m)  Body mass index is 25.58 kg/m.       Physical Examination:   General appearance: alert, well appearing, and in no distress  Mental status: alert, oriented to person, place, and time  Skin: warm & dry   Cardiovascular: normal heart rate noted  Respiratory: normal respiratory effort, no distress  Abdomen: soft, non-tender   Pelvic: VULVA: normal appearing vulva with no masses, tenderness or lesions, 2nd degree perineal lac sutures intact, no evidence of infection at this site. VAGINA: normal appearing vagina with normal color and malodorous pink discharge, no lesions  Extremities: no edema   Results for orders placed or performed in visit on 05/24/18 (from the past 24 hour(s))  POCT Wet Prep Mellody Drown([place]et [place]   Collection Time: 05/25/18  9:06 AM  Result Value Ref Range   Source Wet Prep POC  vaginal    WBC, Wet Prep HPF POC mod    Bacteria Wet Prep HPF POC Few Few   BACTERIA WET PREP MORPHOLOGY POC     Clue Cells Wet Prep HPF POC Many (A) None   Clue Cells Wet Prep Whiff POC Positive Whiff    Yeast Wet Prep HPF POC None None   KOH Wet Prep POC     Trichomonas Wet Prep HPF POC Absent Absent    Assessment & Plan:  1) 13d s/p SVB  2) BV> Rx metronidazole 500mg  BID x 7d for BV, no sex or etoh while taking   3) Painful stitches> intact, well approximated, no evidence of infection, rx sitz bath  Meds:  Meds ordered this encounter  Medications  . metroNIDAZOLE (FLAGYL) 500 MG tablet    Sig: Take 1 tablet (500 mg total) by mouth 2 (two) times daily.    Dispense:  14 tablet    Refill:  0    Order Specific Question:   Supervising Provider    Answer:   Despina HiddenEURE, LUTHER H [[id number]]  . Misc. Devices (SITZ BATH) MISC    Sig: Use with warm water 2-4 times daily    Dispense:  1 each    Refill:  0    Order Specific Question:   Supervising Provider    Answer:   Duane LopeEURE, LUTHER H [[id number]]    Orders Placed This Encounter  Procedures  . POCT Wet Prep [facility]([place]et [place])    Return for As scheduled for pp visit.  Cheral MarkerKimberly R Darcee Dekker CNM, [facility]NP-BC 05/24/18

## 2018-05-25 LAB — POCT WET PREP (WET MOUNT)
Clue Cells Wet Prep Whiff POC: POSITIVE
Trichomonas Wet Prep HPF POC: ABSENT

## 2018-06-02 NOTE — Telephone Encounter (Signed)
Error

## 2018-06-17 ENCOUNTER — Ambulatory Visit: Payer: Medicaid Other | Admitting: Women's Health

## 2018-06-25 ENCOUNTER — Encounter: Payer: Self-pay | Admitting: Women's Health

## 2018-06-25 ENCOUNTER — Ambulatory Visit: Payer: Medicaid Other | Admitting: Women's Health

## 2018-06-25 ENCOUNTER — Other Ambulatory Visit: Payer: Medicaid Other

## 2018-12-28 ENCOUNTER — Telehealth: Payer: Self-pay | Admitting: Adult Health

## 2018-12-28 NOTE — Telephone Encounter (Signed)
Patient called, stated that she has been having issues for a month, pee still stinks, but no discharge.  Has been drinking a lot of water and taking cranberry pills.  Please advise.  [phone]

## 2019-01-06 ENCOUNTER — Encounter: Payer: Self-pay | Admitting: Advanced Practice Midwife

## 2019-01-06 ENCOUNTER — Other Ambulatory Visit: Payer: Self-pay

## 2019-01-06 ENCOUNTER — Ambulatory Visit (INDEPENDENT_AMBULATORY_CARE_PROVIDER_SITE_OTHER): Payer: Medicaid Other | Admitting: Advanced Practice Midwife

## 2019-01-06 DIAGNOSIS — N3000 Acute cystitis without hematuria: Secondary | ICD-10-CM

## 2019-01-06 DIAGNOSIS — L750 Bromhidrosis: Secondary | ICD-10-CM

## 2019-01-06 LAB — POCT URINALYSIS DIPSTICK
Blood, UA: NEGATIVE
Glucose, UA: NEGATIVE
Ketones, UA: NEGATIVE
Leukocytes, UA: NEGATIVE
Nitrite, UA: POSITIVE
Protein, UA: NEGATIVE

## 2019-01-06 MED ORDER — SULFAMETHOXAZOLE-TRIMETHOPRIM 800-160 MG PO TABS: 1.0000 | ORAL_TABLET | Freq: Two times a day (BID) | ORAL | 0 refills | Status: DC

## 2019-01-06 NOTE — Progress Notes (Signed)
[facility] Visit  Patient name: Carla Schaefer MRN [id number]  Date of birth: 10-13-1995  CC & HPI:  Carla Schaefer is a 23 y.o. Caucasian female presenting today for urinary odor and some burning (responded to azo)  No fever, back pain   Pertinent History Reviewed:  Medical & Surgical Hx:   Past Medical History:  Diagnosis Date  . ADHD   . Anxiety   . Chronic headaches   . UTI (lower urinary tract infection) 09/07/2014   4 in past 12 months. No cultures.  Culture 09/07/2014    Past Surgical History:  Procedure Laterality Date  . NO PAST SURGERIES     Family History  Problem Relation Age of Onset  . Cancer Maternal Grandmother   . COPD Mother   . Emphysema Mother   . Thyroid disease Mother   . Cancer Maternal Aunt   . Stroke Maternal Uncle     Current Outpatient Medications:  .  Dextromethorphan-guaiFENesin 10-100 MG/5ML liquid, Take 5 mLs by mouth every 12 (twelve) hours., Disp: , Rfl:  .  docusate sodium (COLACE) 50 MG capsule, Take 50 mg by mouth 2 (two) times daily., Disp: , Rfl:  .  ibuprofen (ADVIL,MOTRIN) 600 MG tablet, Take 1 tablet (600 mg total) by mouth every 6 (six) hours. (Patient not taking: Reported on 01/06/2019), Disp: 30 tablet, Rfl: 0 .  sulfamethoxazole-trimethoprim (BACTRIM DS) 800-160 MG tablet, Take 1 tablet by mouth 2 (two) times daily., Disp: 10 tablet, Rfl: 0 Social History: Reviewed -  reports that she has been smoking cigarettes. She has a 6.50 pack-year smoking history. She has never used smokeless tobacco.  Review of Systems:   Constitutional: Negative for fever and chills Eyes: Negative for visual disturbances Respiratory: Negative for shortness of breath, dyspnea Cardiovascular: Negative for chest pain or palpitations  Gastrointestinal: Negative for vomiting, diarrhea and constipation; no abdominal pain Genitourinary: Negative for dysuria and urgency, vaginal irritation or itching Musculoskeletal: Negative for back pain, joint  pain, myalgias  Neurological: Negative for dizziness and headaches    Objective Findings:    Physical Examination: There were no vitals filed for this visit. General appearance - well appearing, and in no distress Mental status - alert, oriented to person, place, and time Chest:  Normal respiratory effort Heart - normal rate and regular rhythm Pelvic: deferred Musculoskeletal:  Normal range of motion without pain Extremities:  No edema    Results for orders placed or performed in visit on 01/06/19 (from the past 24 hour(s))  POCT Urinalysis Dipstick   Collection Time: 01/06/19 12:00 PM  Result Value Ref Range   Color, UA     Clarity, UA     Glucose, UA Negative Negative   Bilirubin, UA     Ketones, UA neg    Spec Grav, UA     Blood, UA neg    pH, UA     Protein, UA Negative Negative   Urobilinogen, UA     Nitrite, UA positive    Leukocytes, UA Negative Negative   Appearance     Odor        Assessment & Plan:  A:   UTI P:  Rx septra    No follow-ups on file.  Christin Fudge CNM 01/06/2019 12:24 PM

## 2019-01-08 LAB — URINE CULTURE

## 2019-06-09 IMAGING — US US ABDOMEN LIMITED
1 series · 14 of 25 positions shown · non-contrast
Comparison: None.

CLINICAL DATA: Right upper quadrant pain, 35 weeks pregnant

EXAM:
ULTRASOUND ABDOMEN LIMITED RIGHT UPPER QUADRANT

[Series 1: us abdomen limited · 14 of 72 slices shown]
[im 1/72]
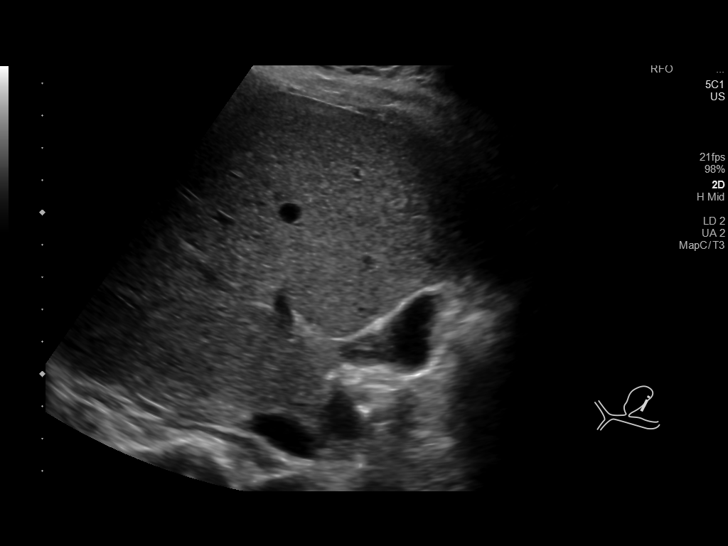
[im 6/72]
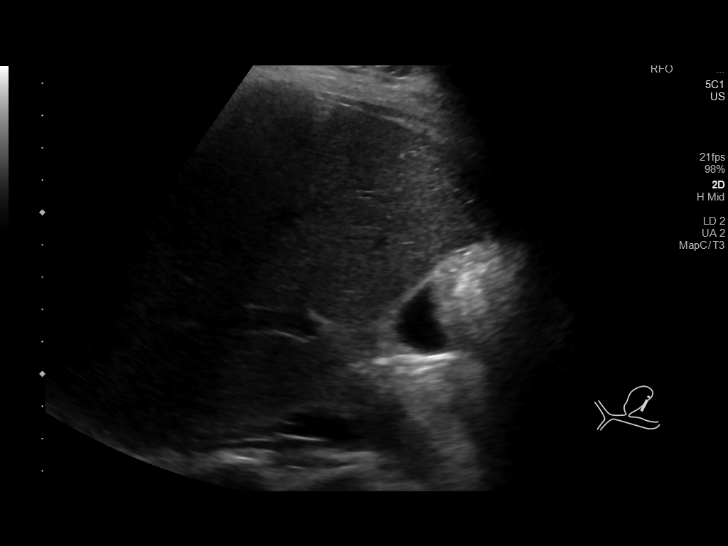
[im 12/72]
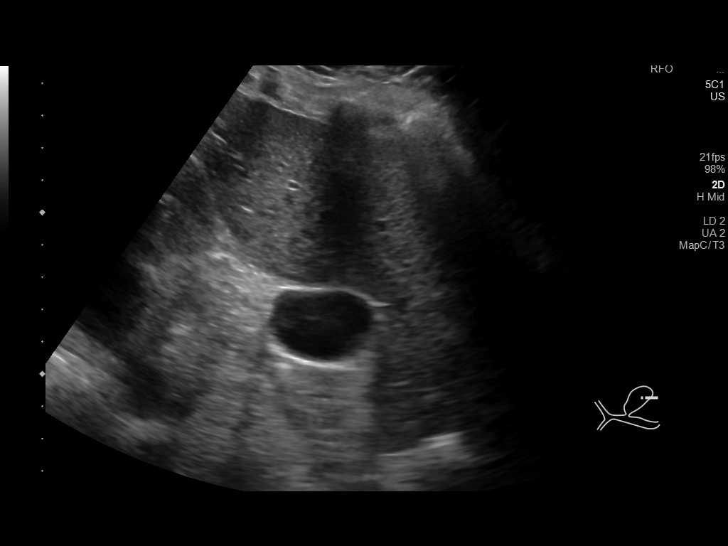
[im 18/72]
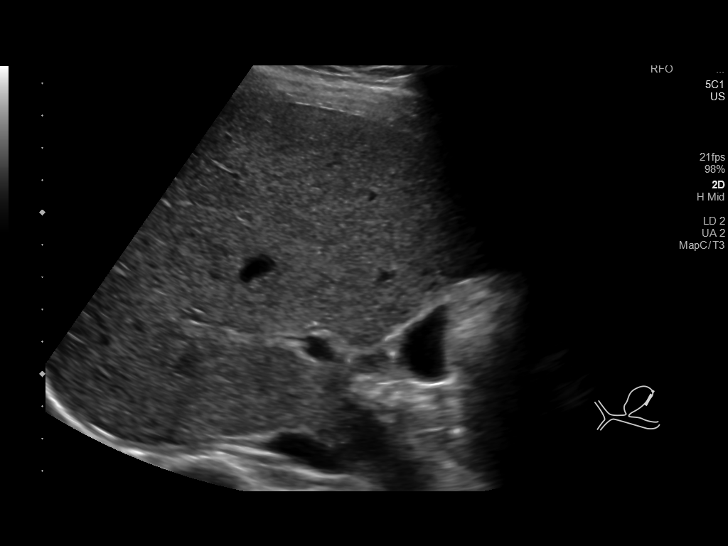
[im 24/72]
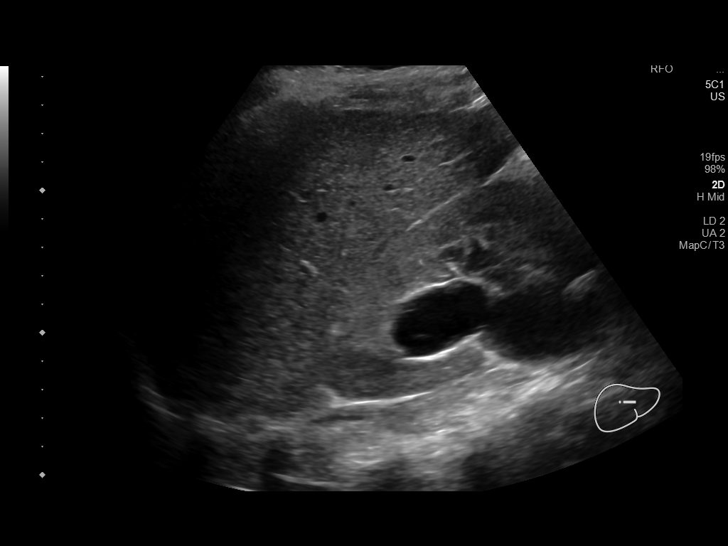
[im 27/72]
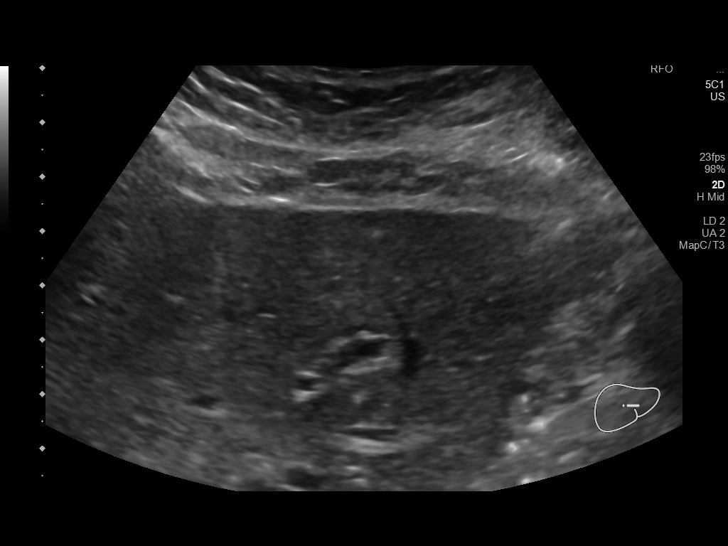
[im 33/72]
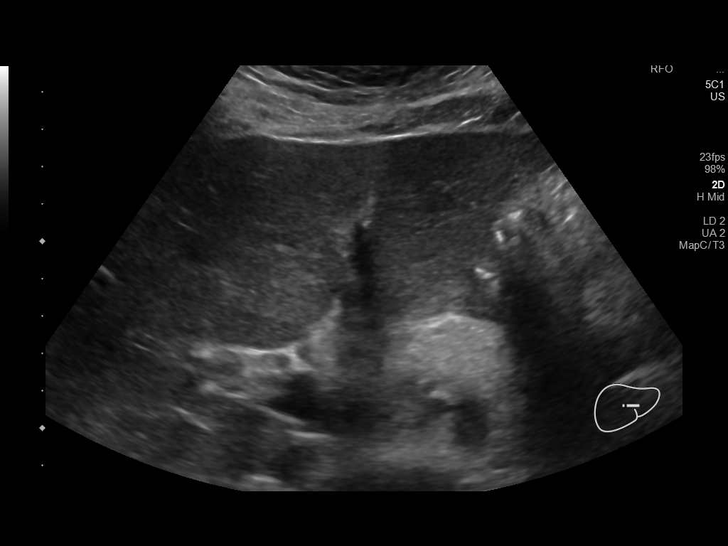
[im 39/72]
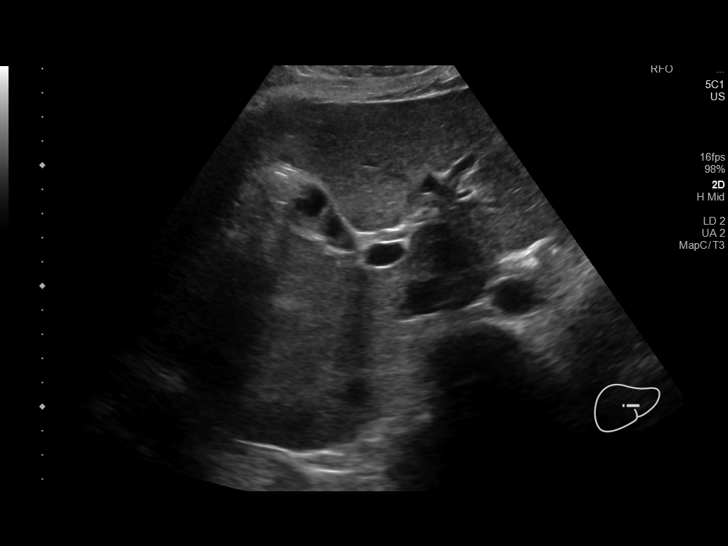
[im 45/72]
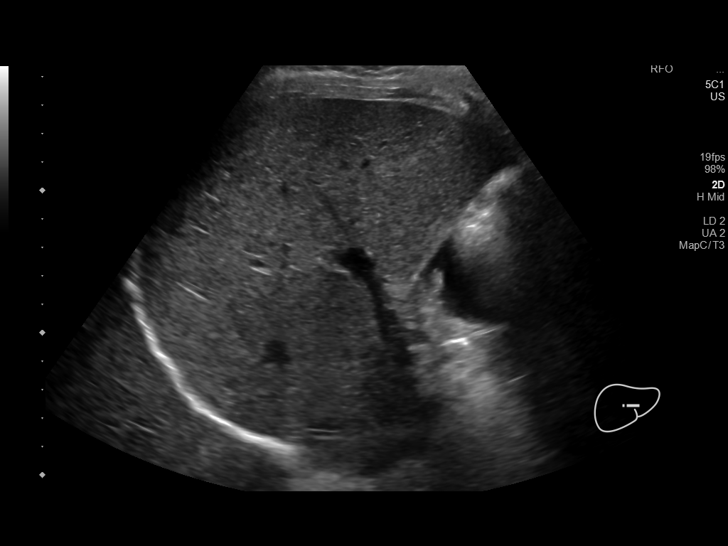
[im 48/72]
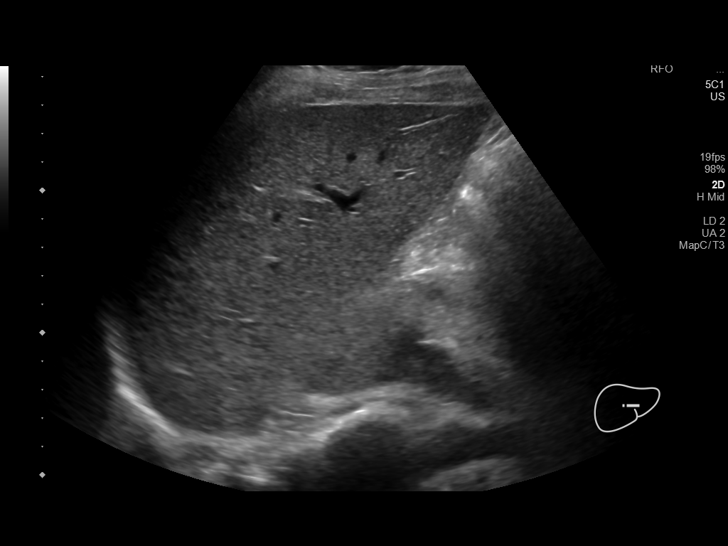
[im 54/72]
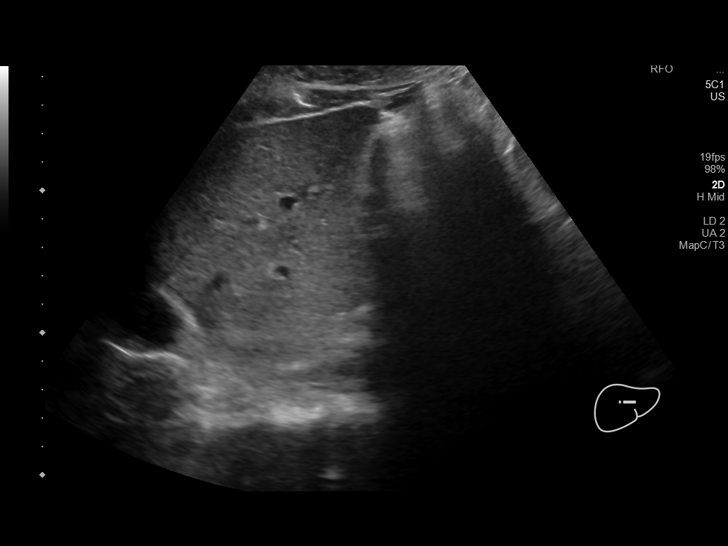
[im 60/72]
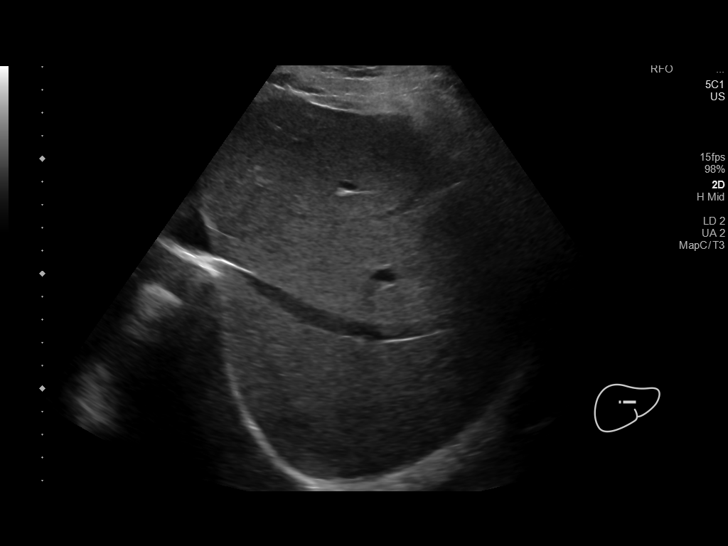
[im 66/72]
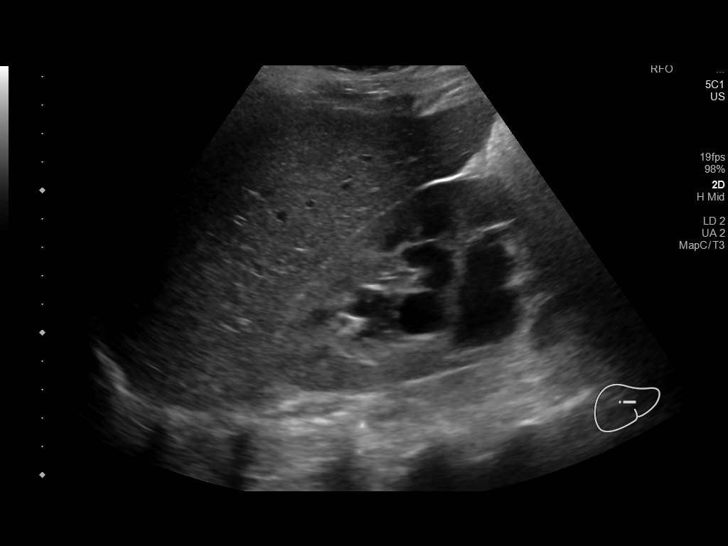
[im 72/72]
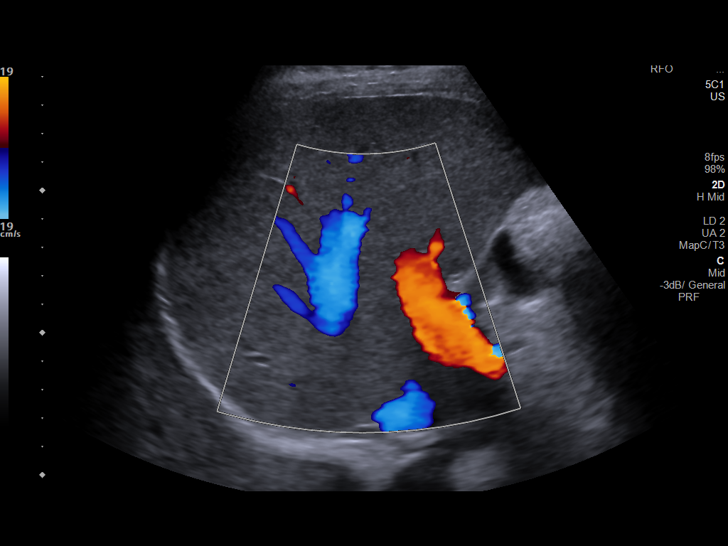

[14 of 25 positions shown; findings below may reference images not displayed]

FINDINGS: Gallbladder:

No gallstones or wall thickening visualized. No sonographic Murphy
sign noted by sonographer.

Common bile duct:

Diameter: 2.5 mm

Liver:

No focal lesion identified. Within normal limits in parenchymal
echogenicity. Portal vein is patent on color Doppler imaging with
normal direction of blood flow towards the liver.

Included views of the right kidney demonstrate moderate to severe
right hydronephrosis.

No free fluid or ascites.
IMPRESSION: Right hydronephrosis

No other acute finding by right upper quadrant ultrasound. Negative
for gallstones.

## 2019-07-13 ENCOUNTER — Ambulatory Visit: Payer: Medicaid Other | Attending: Internal Medicine

## 2019-07-13 DIAGNOSIS — Z20822 Contact with and (suspected) exposure to covid-19: Secondary | ICD-10-CM

## 2019-07-14 LAB — NOVEL CORONAVIRUS, NAA: SARS-CoV-2, NAA: NOT DETECTED

## 2019-08-12 ENCOUNTER — Other Ambulatory Visit: Payer: Self-pay

## 2019-08-12 ENCOUNTER — Emergency Department (HOSPITAL_COMMUNITY)
Admission: EM | Admit: 2019-08-12 | Discharge: 2019-08-12 | Disposition: A | Discharge status: Home / Self Care | Payer: Medicaid Other | Attending: Emergency Medicine | Admitting: Emergency Medicine

## 2019-08-12 DIAGNOSIS — X58XXXA Exposure to other specified factors, initial encounter: Secondary | ICD-10-CM | POA: Insufficient documentation

## 2019-08-12 DIAGNOSIS — F17210 Nicotine dependence, cigarettes, uncomplicated: Secondary | ICD-10-CM | POA: Insufficient documentation

## 2019-08-12 DIAGNOSIS — Y929 Unspecified place or not applicable: Secondary | ICD-10-CM | POA: Insufficient documentation

## 2019-08-12 DIAGNOSIS — Y939 Activity, unspecified: Secondary | ICD-10-CM | POA: Insufficient documentation

## 2019-08-12 DIAGNOSIS — Y999 Unspecified external cause status: Secondary | ICD-10-CM | POA: Insufficient documentation

## 2019-08-12 DIAGNOSIS — S025XXA Fracture of tooth (traumatic), initial encounter for closed fracture: Secondary | ICD-10-CM | POA: Insufficient documentation

## 2019-08-12 DIAGNOSIS — Z79899 Other long term (current) drug therapy: Secondary | ICD-10-CM | POA: Insufficient documentation

## 2019-08-12 MED ORDER — BUPIVACAINE-EPINEPHRINE (PF) 0.5% -1:200000 IJ SOLN
10.0000 mL | Freq: Once | INTRAMUSCULAR | Status: AC
  Administered 2019-08-12: 03:00:00 10 mL

## 2019-08-12 MED ORDER — AMOXICILLIN 250 MG PO CAPS
500.0000 mg | ORAL_CAPSULE | Freq: Once | ORAL | Status: AC
  Administered 2019-08-12: 03:00:00 500 mg via ORAL
  Filled 2019-08-12: qty 2

## 2019-08-12 MED ORDER — AMOXICILLIN 500 MG PO CAPS: 500.0000 mg | ORAL_CAPSULE | Freq: Three times a day (TID) | ORAL | 0 refills | Status: DC

## 2019-08-12 MED ORDER — OXYCODONE-ACETAMINOPHEN 5-325 MG PO TABS
1.0000 | ORAL_TABLET | Freq: Once | ORAL | Status: AC
  Administered 2019-08-12: 03:00:00 1 via ORAL
  Filled 2019-08-12: qty 1

## 2019-08-12 NOTE — ED Provider Notes (Signed)
[facility] EMERGENCY DEPARTMENT Provider Note   CSN: [id number] Arrival date & time: 08/12/19  0120     History Chief Complaint  Patient presents with  . Dental Pain    Carla Schaefer is a 24 y.o. female.  Patient presents to the emergency department for evaluation of tooth ache.  Patient reports that she has been having severe right lower tooth ache for 5 days.  She reports that the tooth chipped precipitating the pain.  No fever or facial swelling.  She has used over-the-counter pain medication as well as hydrocodone without relief.        Past Medical History:  Diagnosis Date  . ADHD   . Anxiety   . Chronic headaches   . UTI (lower urinary tract infection) 09/07/2014   4 in past 12 months. No cultures.  Culture 09/07/2014     Patient Active Problem List   Diagnosis Date Noted  . Leakage of amniotic fluid 05/09/2018  . Gestational diabetes mellitus, class A1 (never took meds) 03/11/2018  . Umbilical hernia 03/08/2018  . Supervision of high risk pregnancy, antepartum 10/26/2017  . Marijuana use 10/26/2017  . Smoker 10/26/2017  . Depression with anxiety 10/26/2017  . Pelvic pain in female 03/21/2015    Past Surgical History:  Procedure Laterality Date  . NO PAST SURGERIES       OB History    Gravida  2   Para  1   Term  1   Preterm      AB  1   Living  1     SAB  0   TAB  1   Ectopic      Multiple  0   Live Births  1           Family History  Problem Relation Age of Onset  . Cancer Maternal Grandmother   . COPD Mother   . Emphysema Mother   . Thyroid disease Mother   . Cancer Maternal Aunt   . Stroke Maternal Uncle     Social History   Tobacco Use  . Smoking status: Current Every Day Smoker    Packs/day: 0.50    Years: 13.00    Pack years: 6.50    Types: Cigarettes  . Smokeless tobacco: Never Used  . Tobacco comment: smokes 10 cig daily  Substance Use Topics  . Alcohol use: Not Currently    Alcohol/week: 0.0  standard drinks    Comment: occasionally  . Drug use: Yes    Types: Marijuana    Comment: at bedtime    Home Medications Prior to Admission medications   Medication Sig Start Date End Date Taking? Authorizing Provider  amoxicillin (AMOXIL) 500 MG capsule Take 1 capsule (500 mg total) by mouth 3 (three) times daily. 08/12/19   Gilda Crease, MD  Dextromethorphan-guaiFENesin 10-100 MG/5ML liquid Take 5 mLs by mouth every 12 (twelve) hours.    [provider]  docusate sodium (COLACE) 50 MG capsule Take 50 mg by mouth 2 (two) times daily.    [provider]  ibuprofen (ADVIL,MOTRIN) 600 MG tablet Take 1 tablet (600 mg total) by mouth every 6 (six) hours. Patient not taking: Reported on 01/06/2019 05/12/18   Gwenevere Abbot, MD  sulfamethoxazole-trimethoprim (BACTRIM DS) 800-160 MG tablet Take 1 tablet by mouth 2 (two) times daily. 01/06/19   Cresenzo-Dishmon, Scarlette Calico, CNM    Allergies    Compazine [prochlorperazine edisylate] and Latex  Review of Systems   Review of Systems  Constitutional: Negative for fever.  HENT: Positive for dental problem.     Physical Exam Updated Vital Signs BP 120/77 (BP Location: Left Arm)   Pulse 95   Temp 98.7 F (37.1 C) (Oral)   Resp 15   Ht 5' (1.524 m)   Wt 65.8 kg   LMP 08/02/2019 (Approximate)   SpO2 100%   BMI 28.32 kg/m   Physical Exam Vitals and nursing note reviewed.  HENT:     Mouth/Throat:     Mouth: No oral lesions.     Dentition: Abnormal dentition. Dental tenderness present. No dental abscesses.   Cardiovascular:     Rate and Rhythm: Normal rate and regular rhythm.  Pulmonary:     Effort: Pulmonary effort is normal.     Breath sounds: Normal breath sounds.  Musculoskeletal:        General: Normal range of motion.     Cervical back: Neck supple.  Skin:    General: Skin is warm and dry.  Neurological:     General: No focal deficit present.     Mental Status: She is alert and oriented to person,  place, and time.     ED Results / Procedures / Treatments   Labs (all labs ordered are listed, but only abnormal results are displayed) Labs Reviewed - No data to display  EKG None  Radiology No results found.  Procedures Dental Block  Date/Time: 08/12/2019 2:46 AM Performed by: Gilda Crease, MD Authorized by: Gilda Crease, MD   Consent:    Consent obtained:  Verbal   Consent given by:  Patient   Risks discussed:  Allergic reaction, nerve damage, swelling, unsuccessful block, intravascular injection, pain and hematoma Universal protocol:    Procedure explained and questions answered to patient or proxy's satisfaction: yes     Site/side marked: yes     Immediately prior to procedure, a time out was called: yes     Patient identity confirmed:  Verbally with patient Indications:    Indications: dental pain   Location:    Block type:  Inferior alveolar   Laterality:  Right Procedure details (see MAR for exact dosages):    Needle gauge:  30 G   Anesthetic injected:  Bupivacaine 0.5% WITH epi   Injection procedure:  Anatomic landmarks identified, introduced needle, incremental injection, negative aspiration for blood and anatomic landmarks palpated Post-procedure details:    Outcome:  Anesthesia achieved   Patient tolerance of procedure:  Tolerated well, no immediate complications Comments:     TemRx temporary filling   (including critical care time)  Medications Ordered in ED Medications  amoxicillin (AMOXIL) capsule 500 mg (has no administration in time range)  bupivacaine-epinephrine (MARCAINE W/ EPI) 0.5% -1:200000 injection 10 mL (10 mLs Infiltration Given 08/12/19 0240)    ED Course  I have reviewed the triage vital signs and the nursing notes.  Pertinent labs & imaging results that were available during my care of the patient were reviewed by me and considered in my medical decision making (see chart for details).    MDM  Rules/Calculators/A&P                      Patient presents with dental pain.  She does have a partial fracture of the tooth in question.  No sign of dental abscess.  Patient provided dental block and then temporary feeling.  Will empirically treat with antibiotics, follow-up with dentist.  Final Clinical Impression(s) / ED Diagnoses Final  diagnoses:  Closed fracture of tooth, initial encounter    Rx / DC Orders ED Discharge Orders         Ordered    amoxicillin (AMOXIL) 500 MG capsule  3 times daily     08/12/19 0249           Orpah Greek, MD 08/12/19 [phone]

## 2019-08-12 NOTE — ED Triage Notes (Signed)
Right lower molar pain x 5 days, taking ibuprofen for same, also took 1/2 hydrocodone tonight without relief.

## 2019-08-16 ENCOUNTER — Emergency Department (HOSPITAL_COMMUNITY)
Admission: EM | Admit: 2019-08-16 | Discharge: 2019-08-16 | Disposition: A | Discharge status: Home / Self Care | Payer: Self-pay | Attending: Emergency Medicine | Admitting: Emergency Medicine

## 2019-08-16 ENCOUNTER — Other Ambulatory Visit: Payer: Self-pay

## 2019-08-16 ENCOUNTER — Encounter (HOSPITAL_COMMUNITY): Payer: Self-pay

## 2019-08-16 DIAGNOSIS — K047 Periapical abscess without sinus: Secondary | ICD-10-CM | POA: Insufficient documentation

## 2019-08-16 DIAGNOSIS — Y9389 Activity, other specified: Secondary | ICD-10-CM | POA: Insufficient documentation

## 2019-08-16 DIAGNOSIS — F17210 Nicotine dependence, cigarettes, uncomplicated: Secondary | ICD-10-CM | POA: Insufficient documentation

## 2019-08-16 DIAGNOSIS — X58XXXA Exposure to other specified factors, initial encounter: Secondary | ICD-10-CM | POA: Insufficient documentation

## 2019-08-16 DIAGNOSIS — Y999 Unspecified external cause status: Secondary | ICD-10-CM | POA: Insufficient documentation

## 2019-08-16 DIAGNOSIS — Y9289 Other specified places as the place of occurrence of the external cause: Secondary | ICD-10-CM | POA: Insufficient documentation

## 2019-08-16 DIAGNOSIS — Z79899 Other long term (current) drug therapy: Secondary | ICD-10-CM | POA: Insufficient documentation

## 2019-08-16 DIAGNOSIS — F909 Attention-deficit hyperactivity disorder, unspecified type: Secondary | ICD-10-CM | POA: Insufficient documentation

## 2019-08-16 DIAGNOSIS — S025XXB Fracture of tooth (traumatic), initial encounter for open fracture: Secondary | ICD-10-CM | POA: Insufficient documentation

## 2019-08-16 MED ORDER — HYDROCODONE-ACETAMINOPHEN 5-325 MG PO TABS: 1.0000 | ORAL_TABLET | Freq: Four times a day (QID) | ORAL | 0 refills | Status: DC | PRN

## 2019-08-16 NOTE — ED Triage Notes (Signed)
Pt was seen on 08/12/19 for right lower molar pain for almost 2 weeks. Pt given amoxicillin, motrin and temporary filling. Filling came out today. Conts with increase pain

## 2019-08-16 NOTE — Discharge Instructions (Addendum)
Finish the antibiotic you were prescribed at your last visit. You may use hydrogen peroxide (1/4 peroxide to 3/4 water) to rinse your mouth and help keep this site clean since brushing is sensitive right now.  You may take the medicine prescribed for pain - this will make you drowsy - do not drive within 4 hours of taking this medicine.  Continue to take your ibuprofen 800 mg but only take it every 8 hours.    As discussed, you may want to call [facility] in [facility] for assistance - I understand that their extraction prices are very reasonable.

## 2019-08-16 NOTE — ED Provider Notes (Signed)
[facility] EMERGENCY DEPARTMENT Provider Note   CSN: [id number] Arrival date & time: 08/16/19  1456     History Chief Complaint  Patient presents with  . Dental Pain    Carla Schaefer is a 24 y.o. female with a recent fractured tooth, seen here 4 days ago and was placed on antibiotics and a temporary filling was placed with plans to follow up with dentistry.  She return today because her temporary filling fell out and now has increased pain.  She is currently taking the amoxicillin she was prescribed at her last visit.  She has not obtained a local dentist yet, she has received a quote for having this tooth extracted, and is currently saving up money so she can have this done.  She denies fevers or chills there is no gingival swelling.  She is able to eat but has difficulty chewing on that side secondary to pain.  She has tried topical medications such as Anbesol which has not been effective.  She is taking ibuprofen 800 mg every 6 hours which also does not relieve her pain.  The history is provided by the patient.       Past Medical History:  Diagnosis Date  . ADHD   . Anxiety   . Chronic headaches   . UTI (lower urinary tract infection) 09/07/2014   4 in past 12 months. No cultures.  Culture 09/07/2014     Patient Active Problem List   Diagnosis Date Noted  . Leakage of amniotic fluid 05/09/2018  . Gestational diabetes mellitus, class A1 (never took meds) 03/11/2018  . Umbilical hernia 03/08/2018  . Supervision of high risk pregnancy, antepartum 10/26/2017  . Marijuana use 10/26/2017  . Smoker 10/26/2017  . Depression with anxiety 10/26/2017  . Pelvic pain in female 03/21/2015    Past Surgical History:  Procedure Laterality Date  . NO PAST SURGERIES       OB History    Gravida  2   Para  1   Term  1   Preterm      AB  1   Living  1     SAB  0   TAB  1   Ectopic      Multiple  0   Live Births  1           Family History  Problem Relation  Age of Onset  . Cancer Maternal Grandmother   . COPD Mother   . Emphysema Mother   . Thyroid disease Mother   . Cancer Maternal Aunt   . Stroke Maternal Uncle     Social History   Tobacco Use  . Smoking status: Current Every Day Smoker    Packs/day: 0.50    Years: 13.00    Pack years: 6.50    Types: Cigarettes  . Smokeless tobacco: Never Used  . Tobacco comment: smokes 10 cig daily  Substance Use Topics  . Alcohol use: Not Currently    Alcohol/week: 0.0 standard drinks    Comment: occasionally  . Drug use: Yes    Types: Marijuana    Comment: at bedtime    Home Medications Prior to Admission medications   Medication Sig Start Date End Date Taking? Authorizing Provider  amoxicillin (AMOXIL) 500 MG capsule Take 1 capsule (500 mg total) by mouth 3 (three) times daily. 08/12/19   Gilda Crease, MD  Dextromethorphan-guaiFENesin 10-100 MG/5ML liquid Take 5 mLs by mouth every 12 (twelve) hours.    [provider]  docusate sodium (COLACE) 50 MG capsule Take 50 mg by mouth 2 (two) times daily.    [provider]  HYDROcodone-acetaminophen (NORCO/VICODIN) 5-325 MG tablet Take 1 tablet by mouth every 6 (six) hours as needed. 08/16/19   Burgess Amor, PA-C  ibuprofen (ADVIL,MOTRIN) 600 MG tablet Take 1 tablet (600 mg total) by mouth every 6 (six) hours. Patient not taking: Reported on 01/06/2019 05/12/18   Gwenevere Abbot, MD  sulfamethoxazole-trimethoprim (BACTRIM DS) 800-160 MG tablet Take 1 tablet by mouth 2 (two) times daily. 01/06/19   Cresenzo-Dishmon, Scarlette Calico, CNM    Allergies    Compazine [prochlorperazine edisylate] and Latex  Review of Systems   Review of Systems  Constitutional: Negative for fever.  HENT: Positive for dental problem. Negative for facial swelling and sore throat.   Respiratory: Negative for shortness of breath.   Musculoskeletal: Negative for neck pain and neck stiffness.    Physical Exam Updated Vital Signs BP 130/75 (BP  Location: Right Arm)   Pulse 82   Temp 98.4 F (36.9 C) (Oral)   Resp 16   LMP 08/02/2019 (Approximate)   SpO2 100%   Physical Exam Constitutional:      General: She is not in acute distress.    Appearance: She is well-developed.  HENT:     Head: Normocephalic and atraumatic.     Jaw: No trismus.     Right Ear: Tympanic membrane and external ear normal.     Left Ear: Tympanic membrane and external ear normal.     Mouth/Throat:     Mouth: No oral lesions.     Dentition: Dental abscesses present.      Comments: Partial temporary filling in place at the right lower first molar.  There is no gingival edema, no fluctuance or induration.  No trismus.  Sublingual space is soft. Eyes:     Conjunctiva/sclera: Conjunctivae normal.  Cardiovascular:     Rate and Rhythm: Normal rate.     Heart sounds: Normal heart sounds.  Pulmonary:     Effort: Pulmonary effort is normal.  Abdominal:     General: There is no distension.  Musculoskeletal:        General: Normal range of motion.     Cervical back: Normal range of motion and neck supple.  Lymphadenopathy:     Cervical: No cervical adenopathy.  Skin:    General: Skin is warm and dry.     Findings: No erythema.  Neurological:     Mental Status: She is alert and oriented to person, place, and time.     ED Results / Procedures / Treatments   Labs (all labs ordered are listed, but only abnormal results are displayed) Labs Reviewed - No data to display  EKG None  Radiology No results found.  Procedures Procedures (including critical care time)  Medications Ordered in ED Medications - No data to display  ED Course  I have reviewed the triage vital signs and the nursing notes.  Pertinent labs & imaging results that were available during my care of the patient were reviewed by me and considered in my medical decision making (see chart for details).    MDM Rules/Calculators/A&P                      Patient with dental  pain and dental fracture, no evidence for acute abscess.  She was encouraged however to complete the antibiotic course she was prescribed previously.  She was given dental referrals  including [facility] in [place] where she may find better pricing for dental extractions.  She was prescribed a small quantity of hydrocodone after reviewing the [place] narcotic database.  Advised that she should take her ibuprofen every 8 hours if taking maximum dosing of 800 mg, patient endorses this recommendation. Final Clinical Impression(s) / ED Diagnoses Final diagnoses:  Open fracture of tooth, initial encounter    Rx / DC Orders ED Discharge Orders         Ordered    HYDROcodone-acetaminophen (NORCO/VICODIN) 5-325 MG tablet  Every 6 hours PRN     08/16/19 1626           Evalee Jefferson, Hershal Coria 08/16/19 1813    Long, Wonda Olds, MD 08/17/19 2107

## 2020-05-27 ENCOUNTER — Ambulatory Visit
Admission: EM | Admit: 2020-05-27 | Discharge: 2020-05-27 | Disposition: A | Discharge status: Home / Self Care | Payer: Medicaid Other | Attending: Emergency Medicine | Admitting: Emergency Medicine

## 2020-05-27 DIAGNOSIS — Z20822 Contact with and (suspected) exposure to covid-19: Secondary | ICD-10-CM

## 2020-05-27 DIAGNOSIS — B349 Viral infection, unspecified: Secondary | ICD-10-CM

## 2020-05-27 DIAGNOSIS — Z1152 Encounter for screening for COVID-19: Secondary | ICD-10-CM

## 2020-05-27 MED ORDER — FLUTICASONE PROPIONATE 50 MCG/ACT NA SUSP: 2.0000 | Freq: Every day | NASAL | 0 refills | Status: DC

## 2020-05-27 MED ORDER — IBUPROFEN 600 MG PO TABS: 600.0000 mg | ORAL_TABLET | Freq: Four times a day (QID) | ORAL | 0 refills | Status: DC | PRN

## 2020-05-27 NOTE — ED Provider Notes (Signed)
HPI  SUBJECTIVE:  Carla Schaefer is a 24 y.o. female who presents with body aches, headaches, nasal congestion, rhinorrhea starting 5 days ago.  Sore throat, loss of taste and smell starting yesterday.  Reports nausea with eating.  She reports a cough in the morning only, occasional diarrhea, mild abdominal pain.  She was exposed to Covid 8 days ago.  She has several negative home test.  No known exposure to the flu.  She did not get the Covid or flu vaccine.  She denies fevers, sinus pain or pressure, shortness of breath, vomiting.  She has tried Tylenol cold and cough which has helped with the sore throat.  Symptoms are worse with movement.  Past medical history negative for pulmonary disease, diabetes, hypertension, chronic kidney disease, coronary disease, HIV, cancer, immunocompromise.  LMP: 2 to 3 days ago.  Denies possibility of being pregnant.  PMD: None.    Past Medical History:  Diagnosis Date  . ADHD   . Anxiety   . Chronic headaches   . UTI (lower urinary tract infection) 09/07/2014   4 in past 12 months. No cultures.  Culture 09/07/2014     Past Surgical History:  Procedure Laterality Date  . NO PAST SURGERIES      Family History  Problem Relation Age of Onset  . Cancer Maternal Grandmother   . COPD Mother   . Emphysema Mother   . Thyroid disease Mother   . Cancer Maternal Aunt   . Stroke Maternal Uncle     Social History   Tobacco Use  . Smoking status: Current Every Day Smoker    Packs/day: 0.50    Years: 13.00    Pack years: 6.50    Types: Cigarettes  . Smokeless tobacco: Never Used  . Tobacco comment: smokes 10 cig daily  Substance Use Topics  . Alcohol use: Not Currently    Alcohol/week: 0.0 standard drinks    Comment: occasionally  . Drug use: Yes    Types: Marijuana    Comment: at bedtime    No current facility-administered medications for this encounter.  Current Outpatient Medications:  .  Dextromethorphan-guaiFENesin 10-100 MG/5ML liquid,  Take 5 mLs by mouth every 12 (twelve) hours., Disp: , Rfl:  .  docusate sodium (COLACE) 50 MG capsule, Take 50 mg by mouth 2 (two) times daily., Disp: , Rfl:  .  fluticasone (FLONASE) 50 MCG/ACT nasal spray, Place 2 sprays into both nostrils daily., Disp: 16 g, Rfl: 0 .  ibuprofen (ADVIL) 600 MG tablet, Take 1 tablet (600 mg total) by mouth every 6 (six) hours as needed., Disp: 30 tablet, Rfl: 0  Allergies  Allergen Reactions  . Compazine [Prochlorperazine Edisylate]     Nausea and vomiting  . Latex      ROS  As noted in HPI.   Physical Exam  BP 117/75 (BP Location: Right Arm)   Pulse (!) 57   Temp 97.8 F (36.6 C) (Oral)   Resp 14   LMP 05/24/2020   SpO2 98%   Constitutional: Well developed, well nourished, no acute distress.  Mildly photophobic Eyes:  EOMI, conjunctiva normal bilaterally HENT: Normocephalic, atraumatic,mucus membranes moist.  Positive nasal congestion, erythematous swollen turbinates.  No maxillary, frontal sinus tenderness.  Erythematous oropharynx, tonsils normal without exudates.  Uvula midline.  Positive cobblestoning, postnasal drip. Neck: Positive anterior cervical lymphadenopathy.  No meningismus. Respiratory: Normal inspiratory effort lungs clear bilaterally, good air movement Cardiovascular: Normal rate regular rhythm no murmurs rubs or gallops GI: nondistended  skin: No rash, skin intact Musculoskeletal: no deformities Neurologic: Alert & oriented x 3, no focal neuro deficits Psychiatric: Speech and behavior appropriate   ED Course   Medications - No data to display  Orders Placed This Encounter  Procedures  . COVID-19, Flu A+B and RSV ([facility])    Standing Status:   Standing    Number of Occurrences:   1    No results found for this or any previous visit (from the past 24 hour(s)). No results found.  ED Clinical Impression  1. Viral illness   2. Encounter for screening for COVID-19   3. Suspected COVID-19 virus infection   4.  Encounter for laboratory testing for COVID-19 virus      ED Assessment/Plan  Covid, flu sent.  Suspect Covid.  She will be a candidate for monoclonal antibody infusion based on BMI.  In the meantime, Flonase, saline irrigation, ibuprofen/Tylenol, Mucinex D, Benadryl/Maalox, push fluids such as Pedialyte.  Will provide referral to [place] primary care for ongoing routine care.  To the ED if she gets worse or for O2 sats below 90.  Discussed labs, MDM, treatment plan, and plan for follow-up with patient. Discussed sn/sx that should prompt return to the ED. patient agrees with plan.   Meds ordered this encounter  Medications  . fluticasone (FLONASE) 50 MCG/ACT nasal spray    Sig: Place 2 sprays into both nostrils daily.    Dispense:  16 g    Refill:  0  . ibuprofen (ADVIL) 600 MG tablet    Sig: Take 1 tablet (600 mg total) by mouth every 6 (six) hours as needed.    Dispense:  30 tablet    Refill:  0    *This clinic note was created using Scientist, clinical (histocompatibility and immunogenetics). Therefore, there may be occasional mistakes despite careful proofreading.   ?   Domenick Gong, MD 05/28/20 [phone]

## 2020-05-27 NOTE — Discharge Instructions (Addendum)
Flonase, saline irrigation with [employer]Med sinus rinse and distilled water as often as you want, 600 mg of ibuprofen and combined with 1000 mg of Tylenol 3-4 times a day as needed for body aches, headaches, fever, Mucinex D, Benadryl/Maalox, push fluids such as Pedialyte.  Go To the ED if you get worse or for O2 sats below 90.

## 2020-05-27 NOTE — ED Triage Notes (Signed)
Pt presents with cough nasal congestion and loss of taste and smell  That began yesterday

## 2020-05-30 LAB — COVID-19, FLU A+B AND RSV
Influenza A, NAA: NOT DETECTED
Influenza B, NAA: NOT DETECTED
RSV, NAA: NOT DETECTED
SARS-CoV-2, NAA: DETECTED — AB

## 2020-12-07 ENCOUNTER — Ambulatory Visit
Admission: RE | Admit: 2020-12-07 | Discharge: 2020-12-07 | Disposition: A | Discharge status: Home / Self Care | Payer: Medicaid Other | Source: Ambulatory Visit | Attending: Emergency Medicine | Admitting: Emergency Medicine

## 2020-12-07 ENCOUNTER — Other Ambulatory Visit: Payer: Self-pay

## 2020-12-07 VITALS — BP 112/75 | HR 88 | Temp 98.5°F | Resp 18

## 2020-12-07 DIAGNOSIS — M79641 Pain in right hand: Secondary | ICD-10-CM

## 2020-12-07 MED ORDER — PREDNISONE 10 MG (21) PO TBPK: ORAL_TABLET | Freq: Every day | ORAL | 0 refills | Status: DC

## 2020-12-07 NOTE — ED Triage Notes (Signed)
Hand pain, swelling x 2 weeks.  Denies any injury.

## 2020-12-07 NOTE — Discharge Instructions (Addendum)
Continue conservative management of rest, ice, and elevation Prednisone prescribed.   Temporary work restrictions given.  Must follow up with occupation health and/or orthopedist for further evaluation and to have restrictions lifted.  WE do not do any FMLA paperwork in urgent care Return or go to the ER if you have any new or worsening symptoms (fever, chills, chest pain, redness, swelling, bruising, deformity, etc...)

## 2020-12-07 NOTE — ED Provider Notes (Signed)
[facility]   [id number] 12/07/20 Arrival Time: 1756  [id number] PAIN  SUBJECTIVE: History from: patient. Carla Schaefer is a 25 y.o. female complains of right hand pain x 2 weeks.  Denies a precipitating event or specific injury, but does admit to repeitive activities at work.  Localizes the pain to the back of hand.  Describes the pain as intermittent.  Has tried OTC medications with minimal relief.  Symptoms are made worse with ROM.  Denies similar symptoms in the past.  Complains of associated swelling, numbness/ tingling.  Denies fever, chills, erythema, ecchymosis, weakness.      ROS: As per HPI.  All other pertinent ROS negative.     Past Medical History:  Diagnosis Date   ADHD    Anxiety    Chronic headaches    UTI (lower urinary tract infection) 09/07/2014   4 in past 12 months. No cultures.  Culture 09/07/2014    Past Surgical History:  Procedure Laterality Date   NO PAST SURGERIES     Allergies  Allergen Reactions   Compazine [Prochlorperazine Edisylate]     Nausea and vomiting   Latex    No current facility-administered medications on file prior to encounter.   Current Outpatient Medications on File Prior to Encounter  Medication Sig Dispense Refill   Dextromethorphan-guaiFENesin 10-100 MG/5ML liquid Take 5 mLs by mouth every 12 (twelve) hours.     docusate sodium (COLACE) 50 MG capsule Take 50 mg by mouth 2 (two) times daily.     fluticasone (FLONASE) 50 MCG/ACT nasal spray Place 2 sprays into both nostrils daily. 16 g 0   ibuprofen (ADVIL) 600 MG tablet Take 1 tablet (600 mg total) by mouth every 6 (six) hours as needed. 30 tablet 0   Social History   Socioeconomic History   Marital status: Single    Spouse name: Not on file   Number of children: Not on file   Years of education: Not on file   Highest education level: Not on file  Occupational History   Not on file  Tobacco Use   Smoking status: Every Day    Packs/day: 0.50    Years: 13.00     Pack years: 6.50    Types: Cigarettes   Smokeless tobacco: Never   Tobacco comments:    smokes 10 cig daily  Substance and Sexual Activity   Alcohol use: Not Currently    Alcohol/week: 0.0 standard drinks    Comment: occasionally   Drug use: Yes    Types: Marijuana    Comment: at bedtime   Sexual activity: Yes    Birth control/protection: None  Other Topics Concern   Not on file  Social History Narrative   Not on file   Social Determinants of Health   Financial Resource Strain: Not on file  Food Insecurity: Not on file  Transportation Needs: Not on file  Physical Activity: Not on file  Stress: Not on file  Social Connections: Not on file  Intimate Partner Violence: Not on file   Family History  Problem Relation Age of Onset   Cancer Maternal Grandmother    COPD Mother    Emphysema Mother    Thyroid disease Mother    Cancer Maternal Aunt    Stroke Maternal Uncle     OBJECTIVE:  Vitals:   12/07/20 1819  BP: 112/75  Pulse: 88  Resp: 18  Temp: 98.5 F (36.9 C)  TempSrc: Oral  SpO2: 97%    General appearance:  ALERT; in no acute distress.  Head: NCAT Lungs: Normal respiratory effort CV: Radial pulse 2+ Musculoskeletal: RT hand Inspection: Skin warm, dry, clear and intact without obvious erythema, effusion, or ecchymosis.  Palpation: diffusely TTP over 2nd and 3rd digits, and 2nd and 3rd MC dorsal aspect ROM: LROM Strength: deferred Stability: negative phalens sign and finkelstein's Skin: warm and dry Neurologic: Ambulates without difficulty; Sensation intact about the upper/ lower extremities Psychological: alert and cooperative; normal mood and affect   ASSESSMENT & PLAN:  1. Right hand pain      Meds ordered this encounter  Medications   predniSONE (STERAPRED UNI-PAK 21 TAB) 10 MG (21) TBPK tablet    Sig: Take by mouth daily. Take 6 tabs by mouth daily  for 2 days, then 5 tabs for 2 days, then 4 tabs for 2 days, then 3 tabs for 2 days, 2 tabs  for 2 days, then 1 tab by mouth daily for 2 days    Dispense:  42 tablet    Refill:  0    Order Specific Question:   Supervising Provider    Answer:   Eustace Moore [[id number]]    Continue conservative management of rest, ice, and elevation Prednisone prescribed.   Temporary work restrictions given.  Must follow up with occupation health and/or orthopedist for further evaluation and to have restrictions lifted.  WE do not do any FMLA paperwork in urgent care Return or go to the ER if you have any new or worsening symptoms (fever, chills, chest pain, redness, swelling, bruising, deformity, etc...)    Reviewed expectations re: course of current medical issues. Questions answered. Outlined signs and symptoms indicating need for more acute intervention. Patient verbalized understanding. After Visit Summary given.     Rennis Harding, PA-C 12/07/20 1835

## 2021-03-13 ENCOUNTER — Ambulatory Visit
Admission: EM | Admit: 2021-03-13 | Discharge: 2021-03-13 | Disposition: A | Discharge status: Home / Self Care | Payer: Medicaid Other | Attending: Family Medicine | Admitting: Family Medicine

## 2021-03-13 ENCOUNTER — Encounter: Payer: Self-pay | Admitting: Emergency Medicine

## 2021-03-13 ENCOUNTER — Other Ambulatory Visit: Payer: Self-pay

## 2021-03-13 DIAGNOSIS — N898 Other specified noninflammatory disorders of vagina: Secondary | ICD-10-CM | POA: Diagnosis not present

## 2021-03-13 LAB — POCT URINALYSIS DIP (MANUAL ENTRY)
Bilirubin, UA: NEGATIVE
Blood, UA: NEGATIVE
Glucose, UA: NEGATIVE mg/dL
Ketones, POC UA: NEGATIVE mg/dL
Nitrite, UA: NEGATIVE
Protein Ur, POC: NEGATIVE mg/dL
Spec Grav, UA: 1.02 (ref 1.010–1.025)
Urobilinogen, UA: 0.2 E.U./dL
pH, UA: 6.5 (ref 5.0–8.0)

## 2021-03-13 LAB — POCT URINE PREGNANCY: Preg Test, Ur: NEGATIVE

## 2021-03-13 MED ORDER — FLUCONAZOLE 150 MG PO TABS: ORAL_TABLET | ORAL | 0 refills | Status: DC

## 2021-03-13 NOTE — ED Provider Notes (Signed)
[facility]   [id number] 03/13/21 Arrival Time: 1635  ASSESSMENT & PLAN:  1. Vaginal irritation      Discharge Instructions      You have had labs (urine culture and vaginal cytology) sent today. We will call you with any significant abnormalities or if there is need to begin or change treatment or pursue further follow up.  You may also review your test results online through MyChart. If you do not have a MyChart account, instructions to sign up should be on your discharge paperwork.     Without s/s of PID.  Will empirically tx for yeast. Meds ordered this encounter  Medications   fluconazole (DIFLUCAN) 150 MG tablet    Sig: Take one tablet by mouth as a single dose. May repeat in 3 days if symptoms persist.    Dispense:  2 tablet    Refill:  0   Labs Reviewed  POCT URINALYSIS DIP (MANUAL ENTRY) - Abnormal; Notable for the following components:      Result Value   Clarity, UA cloudy (*)    Leukocytes, UA Trace (*)    All other components within normal limits  URINE CULTURE  POCT URINE PREGNANCY  CERVICOVAGINAL ANCILLARY ONLY  UPT negative.  Will notify of any positive results. Instructed to refrain from sexual activity for at least seven days.  Reviewed expectations re: course of current medical issues. Questions answered. Outlined signs and symptoms indicating need for more acute intervention. Patient verbalized understanding. After Visit Summary given.  SUBJECTIVE:  Carla Schaefer is a 25 y.o. female who presents with complaint of vaginal irritation. Onset gradual. First noticed yesterday. Describes discharge as whitish/clumpy. Afebrile. No abdominal or pelvic pain. Normal PO intake wihout n/v. No genital rashes or lesions. Reports that she is sexually active; one new female partner OTC treatment: none.  Patient's last menstrual period was 02/16/2021 (exact date).   OBJECTIVE:  Vitals:   03/13/21 1642  BP: 115/75  Pulse: 90  Resp: 16   Temp: 97.6 F (36.4 C)  TempSrc: Temporal  SpO2: 98%    General appearance: alert, cooperative, appears stated age and no distress Lungs: unlabored respirations; speaks full sentences without difficulty Back: no CVA tenderness; FROM at waist Abdomen: soft, non-tender GU: deferred (shows pictures from her phone that show whitish vaginal discharge) Skin: warm and dry Psychological: alert and cooperative; normal mood and affect.  Results for orders placed or performed during the hospital encounter of 03/13/21  POCT urinalysis dipstick  Result Value Ref Range   Color, UA yellow yellow   Clarity, UA cloudy (A) clear   Glucose, UA negative negative mg/dL   Bilirubin, UA negative negative   Ketones, POC UA negative negative mg/dL   Spec Grav, UA 9.935 7.017 - 1.025   Blood, UA negative negative   pH, UA 6.5 5.0 - 8.0   Protein Ur, POC negative negative mg/dL   Urobilinogen, UA 0.2 0.2 or 1.0 E.U./dL   Nitrite, UA Negative Negative   Leukocytes, UA Trace (A) Negative  POCT urine pregnancy  Result Value Ref Range   Preg Test, Ur Negative Negative    Labs Reviewed  POCT URINALYSIS DIP (MANUAL ENTRY) - Abnormal; Notable for the following components:      Result Value   Clarity, UA cloudy (*)    Leukocytes, UA Trace (*)    All other components within normal limits  URINE CULTURE  POCT URINE PREGNANCY  CERVICOVAGINAL ANCILLARY ONLY    Allergies  Allergen  Reactions   Compazine [Prochlorperazine Edisylate]     Nausea and vomiting   Latex     Past Medical History:  Diagnosis Date   ADHD    Anxiety    Chronic headaches    UTI (lower urinary tract infection) 09/07/2014   4 in past 12 months. No cultures.  Culture 09/07/2014    Family History  Problem Relation Age of Onset   Cancer Maternal Grandmother    COPD Mother    Emphysema Mother    Thyroid disease Mother    Cancer Maternal Aunt    Stroke Maternal Uncle    Social History   Socioeconomic History   Marital  status: Single    Spouse name: Not on file   Number of children: Not on file   Years of education: Not on file   Highest education level: Not on file  Occupational History   Not on file  Tobacco Use   Smoking status: Every Day    Packs/day: 0.50    Years: 13.00    Pack years: 6.50    Types: Cigarettes   Smokeless tobacco: Never   Tobacco comments:    smokes 10 cig daily  Substance and Sexual Activity   Alcohol use: Not Currently    Alcohol/week: 0.0 standard drinks    Comment: occasionally   Drug use: Yes    Types: Marijuana    Comment: at bedtime   Sexual activity: Yes    Birth control/protection: None  Other Topics Concern   Not on file  Social History Narrative   Not on file   Social Determinants of Health   Financial Resource Strain: Not on file  Food Insecurity: Not on file  Transportation Needs: Not on file  Physical Activity: Not on file  Stress: Not on file  Social Connections: Not on file  Intimate Partner Violence: Not on file           [place], MD 03/14/21 [phone]

## 2021-03-13 NOTE — ED Triage Notes (Signed)
Lower abd pain and vaginal itching since yesterday.  Noticed white bumps around vaginal area.

## 2021-03-13 NOTE — Discharge Instructions (Signed)
You have had labs (urine culture and vaginal cytology) sent today. We will call you with any significant abnormalities or if there is need to begin or change treatment or pursue further follow up.  You may also review your test results online through MyChart. If you do not have a MyChart account, instructions to sign up should be on your discharge paperwork.  

## 2021-03-14 ENCOUNTER — Telehealth (HOSPITAL_COMMUNITY): Payer: Self-pay | Admitting: Emergency Medicine

## 2021-03-14 LAB — CERVICOVAGINAL ANCILLARY ONLY
Bacterial Vaginitis (gardnerella): POSITIVE — AB
Candida Glabrata: NEGATIVE
Candida Vaginitis: POSITIVE — AB
Chlamydia: NEGATIVE
Comment: NEGATIVE
Comment: NEGATIVE
Comment: NEGATIVE
Comment: NEGATIVE
Comment: NEGATIVE
Comment: NORMAL
Neisseria Gonorrhea: NEGATIVE
Trichomonas: NEGATIVE

## 2021-03-14 MED ORDER — METRONIDAZOLE 500 MG PO TABS: 500.0000 mg | ORAL_TABLET | Freq: Two times a day (BID) | ORAL | 0 refills | Status: DC

## 2021-03-15 LAB — URINE CULTURE

## 2021-07-27 ENCOUNTER — Ambulatory Visit
Admission: EM | Admit: 2021-07-27 | Discharge: 2021-07-27 | Disposition: A | Discharge status: Home / Self Care | Payer: Self-pay | Attending: Family Medicine | Admitting: Family Medicine

## 2021-07-27 ENCOUNTER — Other Ambulatory Visit: Payer: Self-pay

## 2021-07-27 DIAGNOSIS — N898 Other specified noninflammatory disorders of vagina: Secondary | ICD-10-CM

## 2021-07-27 LAB — POCT URINALYSIS DIP (MANUAL ENTRY)
Bilirubin, UA: NEGATIVE
Blood, UA: NEGATIVE
Glucose, UA: NEGATIVE mg/dL
Ketones, POC UA: NEGATIVE mg/dL
Nitrite, UA: NEGATIVE
Protein Ur, POC: NEGATIVE mg/dL
Spec Grav, UA: 1.01 (ref 1.010–1.025)
Urobilinogen, UA: 0.2 E.U./dL
pH, UA: 6.5 (ref 5.0–8.0)

## 2021-07-27 MED ORDER — FLUCONAZOLE 150 MG PO TABS: ORAL_TABLET | ORAL | 0 refills | Status: DC

## 2021-07-27 NOTE — ED Triage Notes (Signed)
Patient states she is on an antibiotic for her teeth and she thinks it gave her a yeast infection  Patient states she tried OTC products without any relief  Patient states it is itchy in that area

## 2021-07-29 NOTE — ED Provider Notes (Signed)
[facility]   [id number] 07/27/21 Arrival Time: [place]:  1. Vaginal discharge    Labs Reviewed  POCT URINALYSIS DIP (MANUAL ENTRY) - Abnormal; Notable for the following components:      Result Value   Color, UA light yellow (*)    Leukocytes, UA Trace (*)    All other components within normal limits   No urine symptoms. Declines cytology and urine culture.  Without s/s of PID.  Begin: Meds ordered this encounter  Medications   fluconazole (DIFLUCAN) 150 MG tablet    Sig: Take one tablet by mouth as a single dose. May repeat in 3 days if symptoms persist.    Dispense:  2 tablet    Refill:  0   Reviewed expectations re: course of current medical issues. Questions answered. Outlined signs and symptoms indicating need for more acute intervention. Patient verbalized understanding. After Visit Summary given.   SUBJECTIVE:  Carla Schaefer is a 26 y.o. female who presents with complaint of vaginal discharge. Feels this is yeast infection; h/o. Mild itching. Not worried re: STI. Afebrile. No abd/pelvic pain.   Patient's last menstrual period was 07/11/2021 (approximate).   OBJECTIVE:  General appearance: alert, cooperative, appears stated age and no distress Lungs: unlabored respirations; speaks full sentences without difficulty Back: no CVA tenderness; FROM at waist Abdomen: soft, non-tender GU: deferred Skin: warm and dry Psychological: alert and cooperative; normal mood and affect.  Results for orders placed or performed during the hospital encounter of 07/27/21  POCT urinalysis dipstick  Result Value Ref Range   Color, UA light yellow (A) yellow   Clarity, UA clear clear   Glucose, UA negative negative mg/dL   Bilirubin, UA negative negative   Ketones, POC UA negative negative mg/dL   Spec Grav, UA 1.010 1.010 - 1.025   Blood, UA negative negative   pH, UA 6.5 5.0 - 8.0   Protein Ur, POC negative negative mg/dL   Urobilinogen,  UA 0.2 0.2 or 1.0 E.U./dL   Nitrite, UA Negative Negative   Leukocytes, UA Trace (A) Negative    Labs Reviewed  POCT URINALYSIS DIP (MANUAL ENTRY) - Abnormal; Notable for the following components:      Result Value   Color, UA light yellow (*)    Leukocytes, UA Trace (*)    All other components within normal limits    Allergies  Allergen Reactions   Compazine [Prochlorperazine Edisylate]     Nausea and vomiting   Latex     Past Medical History:  Diagnosis Date   ADHD    Anxiety    Chronic headaches    UTI (lower urinary tract infection) 09/07/2014   4 in past 12 months. No cultures.  Culture 09/07/2014    Family History  Problem Relation Age of Onset   Cancer Maternal Grandmother    COPD Mother    Emphysema Mother    Thyroid disease Mother    Cancer Maternal Aunt    Stroke Maternal Uncle    Social History   Socioeconomic History   Marital status: Single    Spouse name: Not on file   Number of children: Not on file   Years of education: Not on file   Highest education level: Not on file  Occupational History   Not on file  Tobacco Use   Smoking status: Every Day    Packs/day: 0.50    Years: 13.00    Pack years: 6.50    Types:  Cigarettes   Smokeless tobacco: Never   Tobacco comments:    smokes 10 cig daily  Vaping Use   Vaping Use: Never used  Substance and Sexual Activity   Alcohol use: Not Currently    Alcohol/week: 0.0 standard drinks    Comment: occasionally   Drug use: Yes    Types: Marijuana    Comment: at bedtime   Sexual activity: Yes    Birth control/protection: None  Other Topics Concern   Not on file  Social History Narrative   Not on file   Social Determinants of Health   Financial Resource Strain: Not on file  Food Insecurity: Not on file  Transportation Needs: Not on file  Physical Activity: Not on file  Stress: Not on file  Social Connections: Not on file  Intimate Partner Violence: Not on file           [place], MD 07/29/21 [phone]

## 2021-10-07 ENCOUNTER — Other Ambulatory Visit: Payer: Self-pay

## 2021-10-07 ENCOUNTER — Encounter (HOSPITAL_COMMUNITY): Payer: Self-pay | Admitting: Oral Surgery

## 2021-10-07 NOTE — Progress Notes (Signed)
PCP - denies ?Cardiologist - denies ? ?PPM/ICD - denies ? ? ?Chest x-ray - denies ?EKG - 01/04/16 ?Stress Test - denies ?ECHO - denies ?Cardiac Cath - denies ? ?Sleep Study - denies ? ? ?DM- denies ? ?ASA/Blood Thinner Instructions: n/a ? ? ?ERAS Protcol - no, NPO ? ? ?COVID TEST- n/a ? ? ?Anesthesia review: no ? ?Patient denies shortness of breath, fever, cough and chest pain at PAT appointment ? ? ?All instructions explained to the patient, with a verbal understanding of the material. Patient agrees to go over the instructions while at home for a better understanding. Patient also instructed to notify surgeon if any contact with COVID+ person or if she develops any symptoms. The opportunity to ask questions was provided. ?  ?

## 2021-10-07 NOTE — H&P (Signed)
?  Patient: Elisse Pennick  PID: [id number]  DOB: 06/12/1996  SEX: Female  ? ?Patient referred by DDS for extraction all remaining  teeth ? ?CC: Painful teeth for years. ? ?Past Medical History:  Anxiety, Smoker, Bruise Easily, Mental Health problems, removable dental appliance   ? ?Medications: None   ? ?Allergies:     Latex   ? ?Surgeries:   None    ? ?Social History       ?Smoking: 1/2 ppd           ?Alcohol: ?Drug use:            ?                 ?Exam: Multiple caries, calculus, bone loss all remaining teeth # 1-5, 6-32.  No purulence, edema, fluctuance, trismus. Oral cancer screening negative. Pharynx clear. No lymphadenopathy. ? ?Panorex:Multiple caries, calculus, bone loss all remaining teeth # 1-5, 6-32.  ? ?Assessment: ASA 1. All teeth non-restorable.              ? ?Plan: Extraction all remaining teeth, alveoloplasty, deliver immediate dentures.   Hospital Day surgery.                ? ?Rx: n              ? ?Risks and complications explained. Questions answered.  ? ?Georgia Lopes, DMD ? ?

## 2021-10-10 ENCOUNTER — Encounter (HOSPITAL_COMMUNITY): Payer: Self-pay | Admitting: Oral Surgery

## 2021-10-10 ENCOUNTER — Other Ambulatory Visit: Payer: Self-pay

## 2021-10-10 ENCOUNTER — Ambulatory Visit (HOSPITAL_COMMUNITY): Payer: Medicaid Other | Admitting: Anesthesiology

## 2021-10-10 ENCOUNTER — Ambulatory Visit (HOSPITAL_COMMUNITY)
Admission: RE | Admit: 2021-10-10 | Discharge: 2021-10-10 | Disposition: A | Discharge status: Home / Self Care | Payer: Medicaid Other | Attending: Oral Surgery | Admitting: Oral Surgery

## 2021-10-10 ENCOUNTER — Encounter (HOSPITAL_COMMUNITY)
Admission: RE | Disposition: A | Discharge status: Home / Self Care | Payer: Self-pay | Source: Home / Self Care | Attending: Oral Surgery

## 2021-10-10 ENCOUNTER — Ambulatory Visit (HOSPITAL_BASED_OUTPATIENT_CLINIC_OR_DEPARTMENT_OTHER): Payer: Medicaid Other | Admitting: Anesthesiology

## 2021-10-10 DIAGNOSIS — F17210 Nicotine dependence, cigarettes, uncomplicated: Secondary | ICD-10-CM | POA: Diagnosis not present

## 2021-10-10 DIAGNOSIS — K011 Impacted teeth: Secondary | ICD-10-CM | POA: Insufficient documentation

## 2021-10-10 DIAGNOSIS — K0850 Unsatisfactory restoration of tooth, unspecified: Secondary | ICD-10-CM

## 2021-10-10 DIAGNOSIS — K029 Dental caries, unspecified: Secondary | ICD-10-CM | POA: Diagnosis present

## 2021-10-10 HISTORY — PX: ALVEOLOPLASTY: SHX5710

## 2021-10-10 HISTORY — PX: TOOTH EXTRACTION: SHX859

## 2021-10-10 HISTORY — DX: Depression, unspecified: F32.A

## 2021-10-10 LAB — POCT PREGNANCY, URINE: Preg Test, Ur: NEGATIVE

## 2021-10-10 SURGERY — DENTAL RESTORATION/EXTRACTIONS
Anesthesia: General

## 2021-10-10 MED ORDER — DEXAMETHASONE SODIUM PHOSPHATE 10 MG/ML IJ SOLN
INTRAMUSCULAR | Status: DC | PRN
  Administered 2021-10-10: 10 mg via INTRAVENOUS

## 2021-10-10 MED ORDER — OXYMETAZOLINE HCL 0.05 % NA SOLN
NASAL | Status: DC | PRN
  Administered 2021-10-10 (×2): 2 via NASAL

## 2021-10-10 MED ORDER — FENTANYL CITRATE (PF) 100 MCG/2ML IJ SOLN
25.0000 ug | INTRAMUSCULAR | Status: DC | PRN
  Administered 2021-10-10 (×2): 25 ug via INTRAVENOUS

## 2021-10-10 MED ORDER — CHLORHEXIDINE GLUCONATE 0.12 % MT SOLN
15.0000 mL | OROMUCOSAL | Status: DC
  Filled 2021-10-10 (×2): qty 15

## 2021-10-10 MED ORDER — FENTANYL CITRATE (PF) 100 MCG/2ML IJ SOLN
INTRAMUSCULAR | Status: AC
  Filled 2021-10-10: qty 2

## 2021-10-10 MED ORDER — SUGAMMADEX SODIUM 200 MG/2ML IV SOLN
INTRAVENOUS | Status: DC | PRN
  Administered 2021-10-10: 200 mg via INTRAVENOUS

## 2021-10-10 MED ORDER — LIDOCAINE-EPINEPHRINE 2 %-1:100000 IJ SOLN
INTRAMUSCULAR | Status: DC | PRN
Start: 2021-10-10 — End: 2021-10-10
  Administered 2021-10-10: 15 mL via INTRADERMAL

## 2021-10-10 MED ORDER — OXYCODONE HCL 5 MG/5ML PO SOLN: 5.0000 mg | Freq: Once | ORAL | Status: DC | PRN

## 2021-10-10 MED ORDER — ACETAMINOPHEN 500 MG PO TABS
1000.0000 mg | ORAL_TABLET | Freq: Once | ORAL | Status: AC
  Administered 2021-10-10: 1000 mg via ORAL
  Filled 2021-10-10: qty 2

## 2021-10-10 MED ORDER — OXYCODONE HCL 5 MG PO TABS: 5.0000 mg | ORAL_TABLET | Freq: Once | ORAL | Status: DC | PRN

## 2021-10-10 MED ORDER — LACTATED RINGERS IV SOLN: INTRAVENOUS | Status: DC

## 2021-10-10 MED ORDER — AMISULPRIDE (ANTIEMETIC) 5 MG/2ML IV SOLN: 10.0000 mg | Freq: Once | INTRAVENOUS | Status: DC | PRN

## 2021-10-10 MED ORDER — PROPOFOL 10 MG/ML IV BOLUS
INTRAVENOUS | Status: DC | PRN
  Administered 2021-10-10: 200 mg via INTRAVENOUS

## 2021-10-10 MED ORDER — PHENYLEPHRINE 40 MCG/ML (10ML) SYRINGE FOR IV PUSH (FOR BLOOD PRESSURE SUPPORT)
PREFILLED_SYRINGE | INTRAVENOUS | Status: AC
  Filled 2021-10-10: qty 10

## 2021-10-10 MED ORDER — MIDAZOLAM HCL 2 MG/2ML IJ SOLN
INTRAMUSCULAR | Status: DC | PRN
  Administered 2021-10-10: 2 mg via INTRAVENOUS

## 2021-10-10 MED ORDER — MIDAZOLAM HCL 2 MG/2ML IJ SOLN
INTRAMUSCULAR | Status: AC
  Filled 2021-10-10: qty 2

## 2021-10-10 MED ORDER — ROCURONIUM BROMIDE 10 MG/ML (PF) SYRINGE
PREFILLED_SYRINGE | INTRAVENOUS | Status: DC | PRN
  Administered 2021-10-10: 70 mg via INTRAVENOUS
  Administered 2021-10-10: 30 mg via INTRAVENOUS

## 2021-10-10 MED ORDER — DEXMEDETOMIDINE (PRECEDEX) IN NS 20 MCG/5ML (4 MCG/ML) IV SYRINGE
PREFILLED_SYRINGE | INTRAVENOUS | Status: AC
  Filled 2021-10-10: qty 5

## 2021-10-10 MED ORDER — DEXAMETHASONE SODIUM PHOSPHATE 10 MG/ML IJ SOLN
INTRAMUSCULAR | Status: AC
  Filled 2021-10-10: qty 1

## 2021-10-10 MED ORDER — ONDANSETRON HCL 4 MG/2ML IJ SOLN: 4.0000 mg | Freq: Once | INTRAMUSCULAR | Status: DC | PRN

## 2021-10-10 MED ORDER — FENTANYL CITRATE (PF) 250 MCG/5ML IJ SOLN
INTRAMUSCULAR | Status: AC
  Filled 2021-10-10: qty 5

## 2021-10-10 MED ORDER — SODIUM CHLORIDE 0.9 % IR SOLN
Status: DC | PRN
  Administered 2021-10-10: 1000 mL

## 2021-10-10 MED ORDER — CEFAZOLIN SODIUM-DEXTROSE 2-4 GM/100ML-% IV SOLN
2.0000 g | INTRAVENOUS | Status: AC
  Administered 2021-10-10: 2 g via INTRAVENOUS
  Filled 2021-10-10: qty 100

## 2021-10-10 MED ORDER — FENTANYL CITRATE (PF) 250 MCG/5ML IJ SOLN
INTRAMUSCULAR | Status: DC | PRN
Start: 2021-10-10 — End: 2021-10-10
  Administered 2021-10-10 (×2): 100 ug via INTRAVENOUS
  Administered 2021-10-10: 50 ug via INTRAVENOUS

## 2021-10-10 MED ORDER — LIDOCAINE 2% (20 MG/ML) 5 ML SYRINGE
INTRAMUSCULAR | Status: DC | PRN
  Administered 2021-10-10: 60 mg via INTRAVENOUS

## 2021-10-10 MED ORDER — OXYMETAZOLINE HCL 0.05 % NA SOLN
NASAL | Status: AC
  Filled 2021-10-10: qty 30

## 2021-10-10 MED ORDER — AMOXICILLIN 500 MG PO CAPS: 500.0000 mg | ORAL_CAPSULE | Freq: Three times a day (TID) | ORAL | 0 refills | Status: DC

## 2021-10-10 MED ORDER — ONDANSETRON HCL 4 MG/2ML IJ SOLN
INTRAMUSCULAR | Status: AC
  Filled 2021-10-10: qty 2

## 2021-10-10 MED ORDER — LIDOCAINE 2% (20 MG/ML) 5 ML SYRINGE
INTRAMUSCULAR | Status: AC
  Filled 2021-10-10: qty 5

## 2021-10-10 MED ORDER — ROCURONIUM BROMIDE 10 MG/ML (PF) SYRINGE
PREFILLED_SYRINGE | INTRAVENOUS | Status: AC
  Filled 2021-10-10: qty 10

## 2021-10-10 MED ORDER — 0.9 % SODIUM CHLORIDE (POUR BTL) OPTIME
TOPICAL | Status: DC | PRN
  Administered 2021-10-10: 1000 mL

## 2021-10-10 MED ORDER — DEXMEDETOMIDINE (PRECEDEX) IN NS 20 MCG/5ML (4 MCG/ML) IV SYRINGE
PREFILLED_SYRINGE | INTRAVENOUS | Status: DC | PRN
  Administered 2021-10-10 (×4): 10 ug via INTRAVENOUS

## 2021-10-10 MED ORDER — SCOPOLAMINE 1 MG/3DAYS TD PT72
1.0000 | MEDICATED_PATCH | TRANSDERMAL | Status: DC
  Administered 2021-10-10: 1.5 mg via TRANSDERMAL
  Filled 2021-10-10: qty 1

## 2021-10-10 MED ORDER — ONDANSETRON HCL 4 MG/2ML IJ SOLN
INTRAMUSCULAR | Status: DC | PRN
  Administered 2021-10-10: 4 mg via INTRAVENOUS

## 2021-10-10 MED ORDER — PROPOFOL 10 MG/ML IV BOLUS
INTRAVENOUS | Status: AC
  Filled 2021-10-10: qty 20

## 2021-10-10 MED ORDER — OXYCODONE-ACETAMINOPHEN 5-325 MG PO TABS: 1.0000 | ORAL_TABLET | ORAL | 0 refills | Status: DC | PRN

## 2021-10-10 MED ORDER — LIDOCAINE-EPINEPHRINE 2 %-1:100000 IJ SOLN
INTRAMUSCULAR | Status: AC
  Filled 2021-10-10: qty 1

## 2021-10-10 SURGICAL SUPPLY — 36 items
BAG COUNTER SPONGE SURGICOUNT (BAG) IMPLANT
BLADE SURG 15 STRL LF DISP TIS (BLADE) ×2 IMPLANT
BLADE SURG 15 STRL SS (BLADE) ×1
BUR CROSS CUT FISSURE 1.6 (BURR) ×3 IMPLANT
BUR EGG ELITE 4.0 (BURR) ×3 IMPLANT
CANISTER SUCT 3000ML PPV (MISCELLANEOUS) ×3 IMPLANT
COVER SURGICAL LIGHT HANDLE (MISCELLANEOUS) ×3 IMPLANT
GAUZE PACKING FOLDED 2  STR (GAUZE/BANDAGES/DRESSINGS) ×1
GAUZE PACKING FOLDED 2 STR (GAUZE/BANDAGES/DRESSINGS) ×2 IMPLANT
GLOVE BIO SURGEON STRL SZ 6.5 (GLOVE) IMPLANT
GLOVE BIO SURGEON STRL SZ7 (GLOVE) IMPLANT
GLOVE BIO SURGEON STRL SZ8 (GLOVE) ×3 IMPLANT
GLOVE BIOGEL PI IND STRL 6.5 (GLOVE) IMPLANT
GLOVE BIOGEL PI IND STRL 7.0 (GLOVE) IMPLANT
GLOVE BIOGEL PI INDICATOR 6.5 (GLOVE)
GLOVE BIOGEL PI INDICATOR 7.0 (GLOVE)
GOWN STRL REUS W/ TWL LRG LVL3 (GOWN DISPOSABLE) ×2 IMPLANT
GOWN STRL REUS W/ TWL XL LVL3 (GOWN DISPOSABLE) ×2 IMPLANT
GOWN STRL REUS W/TWL LRG LVL3 (GOWN DISPOSABLE) ×2
GOWN STRL REUS W/TWL XL LVL3 (GOWN DISPOSABLE) ×2
IV NS 1000ML (IV SOLUTION) ×1
IV NS 1000ML BAXH (IV SOLUTION) ×2 IMPLANT
KIT BASIN OR (CUSTOM PROCEDURE TRAY) ×3 IMPLANT
KIT TURNOVER KIT B (KITS) ×3 IMPLANT
NDL HYPO 25GX1X1/2 BEV (NEEDLE) ×4 IMPLANT
NEEDLE HYPO 25GX1X1/2 BEV (NEEDLE) ×6 IMPLANT
NS IRRIG 1000ML POUR BTL (IV SOLUTION) ×3 IMPLANT
PAD ARMBOARD 7.5X6 YLW CONV (MISCELLANEOUS) ×3 IMPLANT
SLEEVE IRRIGATION ELITE 7 (MISCELLANEOUS) ×3 IMPLANT
SPONGE SURGIFOAM ABS GEL 12-7 (HEMOSTASIS) IMPLANT
SUT CHROMIC 3 0 PS 2 (SUTURE) ×4 IMPLANT
SYR BULB IRRIG 60ML STRL (SYRINGE) ×3 IMPLANT
SYR CONTROL 10ML LL (SYRINGE) ×3 IMPLANT
TRAY ENT MC OR (CUSTOM PROCEDURE TRAY) ×3 IMPLANT
TUBING IRRIGATION (MISCELLANEOUS) ×3 IMPLANT
YANKAUER SUCT BULB TIP NO VENT (SUCTIONS) ×3 IMPLANT

## 2021-10-10 NOTE — Anesthesia Procedure Notes (Signed)
Procedure Name: Intubation ?Date/Time: 10/10/2021 11:49 AM ?Performed by: Rosiland Oz, CRNA ?Pre-anesthesia Checklist: Patient identified, Emergency Drugs available, Suction available, Timeout performed and Patient being monitored ?Patient Re-evaluated:Patient Re-evaluated prior to induction ?Oxygen Delivery Method: Circle system utilized ?Preoxygenation: Pre-oxygenation with 100% oxygen ?Induction Type: IV induction ?Laryngoscope Size: Hyacinth Meeker and 3 ?Grade View: Grade I ?Nasal Tubes: Nasal prep performed and Nasal Sheilah Pigeon ?Tube size: 6.5 mm ?Number of attempts: 1 ?Placement Confirmation: ETT inserted through vocal cords under direct vision, positive ETCO2 and breath sounds checked- equal and bilateral ?Tube secured with: Tape ?Dental Injury: Teeth and Oropharynx as per pre-operative assessment  ? ? ? ? ?

## 2021-10-10 NOTE — Anesthesia Preprocedure Evaluation (Signed)
Anesthesia Evaluation  ?Patient identified by MRN, date of birth, ID band ?Patient awake ? ? ? ?Reviewed: ?Allergy & Precautions, NPO status , Patient's Chart, lab work & pertinent test results ? ?Airway ?Mallampati: II ? ?TM Distance: >3 FB ?Neck ROM: Full ? ? ? Dental ?no notable dental hx. ? ?  ?Pulmonary ?neg pulmonary ROS, Current Smoker,  ?  ?Pulmonary exam normal ?breath sounds clear to auscultation ? ? ? ? ? ? Cardiovascular ?Exercise Tolerance: Good ?negative cardio ROS ?Normal cardiovascular exam ?Rhythm:Regular Rate:Normal ? ? ?  ?Neuro/Psych ? Headaches, PSYCHIATRIC DISORDERS Anxiety Depression   ? GI/Hepatic ?negative GI ROS, (+)  ?  ? substance abuse ? marijuana use,   ?Endo/Other  ? ? Renal/GU ?negative Renal ROS  ?negative genitourinary ?  ?Musculoskeletal ?negative musculoskeletal ROS ?(+)  ? Abdominal ?  ?Peds ?negative pediatric ROS ?(+)  Hematology ?negative hematology ROS ?(+)   ?Anesthesia Other Findings ? ? Reproductive/Obstetrics ?negative OB ROS ? ?  ? ? ? ? ? ? ? ? ? ? ? ? ? ?  ?  ? ? ? ? ? ? ? ? ?Anesthesia Physical ?Anesthesia Plan ? ?ASA: 2 ? ?Anesthesia Plan: General  ? ?Post-op Pain Management: Tylenol PO (pre-op)*  ? ?Induction: Intravenous ? ?PONV Risk Score and Plan: 2 and Treatment may vary due to age or medical condition, Ondansetron, Scopolamine patch - Pre-op, Midazolam and Dexamethasone ? ?Airway Management Planned: Nasal ETT ? ?Additional Equipment: None ? ?Intra-op Plan:  ? ?Post-operative Plan: Extubation in OR ? ?Informed Consent: I have reviewed the patients History and Physical, chart, labs and discussed the procedure including the risks, benefits and alternatives for the proposed anesthesia with the patient or authorized representative who has indicated his/her understanding and acceptance.  ? ? ? ?Dental advisory given ? ?Plan Discussed with: CRNA and Anesthesiologist ? ?Anesthesia Plan Comments:   ? ? ? ? ? ?Anesthesia Quick  Evaluation ? ?

## 2021-10-10 NOTE — Transfer of Care (Signed)
Immediate Anesthesia Transfer of Care Note ? ?Patient: Carla Schaefer ? ?Procedure(s) Performed: FULL DENTAL EXTRACTIONS OF REMAINING TEETH WITH DELIVERY OF IMMEDIATE DENTURES ?ALVEOLOPLASTY ? ?Patient Location: PACU ? ?Anesthesia Type:General ? ?Level of Consciousness: drowsy and patient cooperative ? ?Airway & Oxygen Therapy: Patient Spontanous Breathing ? ?Post-op Assessment: Report given to RN and Post -op Vital signs reviewed and stable ? ?Post vital signs: Reviewed and stable ? ?Last Vitals:  ?Vitals Value Taken Time  ?BP    ?Temp    ?Pulse    ?Resp    ?SpO2    ? ? ?Last Pain:  ?Vitals:  ? 10/10/21 0817  ?TempSrc:   ?PainSc: 5   ?   ? ?Patients Stated Pain Goal: 0 (10/10/21 0817) ? ?Complications: No notable events documented. ?

## 2021-10-10 NOTE — Op Note (Signed)
10/10/2021 ? ?11:44 AM ? ?PATIENT:  Carla Schaefer  26 y.o. female ? ?PRE-OPERATIVE DIAGNOSIS:  Non-restorable teeth # 1, 2, 3, 4, 8, 9, 10, 11, 12, 13, 14, 15, 16, 18, 19, 20, 21, 22, 23, 24, 25, 26, 27, 28, 29, 30, 31 SECONDARY TO DENTAL CARIES, IMPACTED TEETH # 17, 32.  ? ?POST-OPERATIVE DIAGNOSIS:  SAME ? ?PROCEDURE:  Procedure(s): ?FULL DENTAL EXTRACTIONS OF REMAINING TEETH ?ALVEOLOPLASTY ? ?SURGEON:  Surgeon(s): ?Ocie Doyne, DMD ? ?ANESTHESIA:   local and general ? ?EBL:  minimal ? ?DRAINS: none  ? ?SPECIMEN:  No Specimen ? ?COUNTS:  YES ? ?PLAN OF CARE: Discharge to home after PACU ? ?PATIENT DISPOSITION:  PACU - hemodynamically stable. ?  ?PROCEDURE DETAILS: ?Dictation # [id number] ? ?Georgia Lopes, DMD ?10/10/2021 ?11:44 AM ? ? ? ? ? ? ? ? ? ? ? ? ? ? ? ?  ?

## 2021-10-10 NOTE — H&P (Signed)
H&P documentation  -History and Physical Reviewed  -Patient has been re-examined  -No change in the plan of care  Allison Deshotels  

## 2021-10-11 ENCOUNTER — Encounter (HOSPITAL_COMMUNITY): Payer: Self-pay | Admitting: Oral Surgery

## 2021-10-11 NOTE — Anesthesia Postprocedure Evaluation (Signed)
Anesthesia Post Note ? ?Patient: Carla Schaefer ? ?Procedure(s) Performed: FULL DENTAL EXTRACTIONS OF REMAINING TEETH WITH DELIVERY OF IMMEDIATE DENTURES ?ALVEOLOPLASTY ? ?  ? ?Patient location during evaluation: PACU ?Anesthesia Type: General ?Level of consciousness: sedated and patient cooperative ?Pain management: pain level controlled ?Vital Signs Assessment: post-procedure vital signs reviewed and stable ?Respiratory status: spontaneous breathing ?Cardiovascular status: stable ?Anesthetic complications: no ? ? ?No notable events documented. ? ?Last Vitals:  ?Vitals:  ? 10/10/21 1255 10/10/21 1302  ?BP: 102/69 112/73  ?Pulse: (!) 58 61  ?Resp: 13 13  ?Temp: (!) 36.4 ?C (!) 36.4 ?C  ?SpO2: 98% 99%  ?  ?Last Pain:  ?Vitals:  ? 10/10/21 1240  ?TempSrc:   ?PainSc: Asleep  ? ? ?  ?  ?  ?  ?  ?  ? ?Lewie Loron ? ? ? ? ?

## 2021-10-11 NOTE — Op Note (Signed)
NAME: Renk, Akyla M. ?MEDICAL RECORD NO: [id number] ?ACCOUNT NO: [id number] ?DATE OF BIRTH: Nov 09, 1995 ?FACILITY: [facility] ?LOCATION: [facility] ?PHYSICIAN: Gae Bon, DDS ? ?Operative Report  ? ?DATE OF PROCEDURE: 10/10/2021 ? ?PREOPERATIVE DIAGNOSES:  Nonrestorable teeth numbers 1, 2, 3, 4, 8, 9, 10, 11, 12, 13, 14, 15, 16, 18, 19, 20, 21, 22, 23, 24, 25, 26, 27, 28, 29, 30, 31 secondary to dental caries, impacted teeth #17 and 32. ? ?POSTOPERATIVE DIAGNOSES:   Nonrestorable teeth numbers 1, 2, 3, 4, 8, 9, 10, 11, 12, 13, 14, 15, 16, 18, 19, 20, 21, 22, 23, 24, 25, 26, 27, 28, 29, 30, 31 secondary to dental caries, impacted teeth #17 and 32. ? ?PROCEDURE:  Full mouth Extraction of all remaining teeth with alveoloplasty and insertion of immediate upper and lower dentures. ? ?SURGEON:  Gae Bon, DDS ? ?ANESTHESIA:  General, nasal intubation, Dr. Elgie Congo attending. ? ?DESCRIPTION OF PROCEDURE:  The patient was taken to the operating room and placed on the table in supine position.  General anesthesia was administered and nasal endotracheal tube was placed and secured.  The eyes were protected.  The patient was draped  ?for surgery.  Timeout was performed.  The posterior pharynx was suctioned and a throat pack was placed.  2% lidocaine 1:100,000 epinephrine was infiltrated in an inferior alveolar block on the right and left sides and in buccal and palatal infiltration  ?of the maxilla.  A bite block was placed on the right side of the mouth.  A sweetheart retractor was used to retract the tongue.  A #15 blade was used to make an incision overlying tooth #17 carried forward in the gingival sulcus both buccally and then  ?lingually from tooth #17 to tooth #26.  The periosteum was reflected from around these teeth.  The teeth were elevated and removed from the mouth with the dental forceps.  The sockets were curetted and the tissue was trimmed to allow for closure.  Then,  ?attention was turned to the left maxilla.  The 15 blade was used to make an incision around teeth numbers 16, 15, 14, 13, 12, 11, 10, 9, 8.  The periosteum was reflected from around these teeth.  The teeth were elevated with a 301 elevator.  Tooth #16,  ?13, 12, 11, 10, 9, 8 were removed with the dental forceps.  Teeth #14 and 15 required sectioning and removal of roots individually using the [employer] handpiece under irrigation. When these teeth were removed, the sockets were curetted and then tissue was  ?trimmed.  Then, attention was turned to the right mandible.  The bite block and sweetheart retractor were repositioned to the other side of the mouth.  A 15 blade was used to make an incision around teeth numbers 27, 28, 29, 30, 31 and overlying tooth  ?#32, which was impacted.  The periosteum was reflected from around these teeth.  Bone was removed from around tooth #32 and 31 and then the teeth were elevated.  Tooth #32 was sectioned and then removed in pieces.  Tooth #31 was removed with dental  ?forceps.  Teeth #27, 28, 29 and 30 were removed with the dental forceps as well.  The sockets were curetted.  Tissue was trimmed and attention was turned to the right maxilla. The 15 blade was used to make an incision around teeth numbers 1, 2, 3, 4.  ?Periosteum was reflected.  Teeth were elevated and removed from the mouth with the dental forceps.  The sockets  were curetted and then the tissue was trimmed.  The upper and lower prefabricated dentures were tried on the mouth.  The lower denture had  ?severe undercuts and the denture was trimmed using the egg bur over at the separate table and then rinsed and repositioned in the mouth.  There was still some bony interferences, so alveoplasty was then performed on the left and right mandible using the  ?egg bur followed by the bone file.  Then, the denture was refitted and found to have a good fit.  The upper denture fit was satisfactory without alveoloplasty.  Then, the upper and lower arch incisions were closed.   The patient was irrigated, suctioned.  ? Throat pack was removed.  The dentures were placed in the mouth and the patient was left under the care of anesthesia for extubation and transport to recovery room with plans for discharge home through day surgery. ? ?ESTIMATED BLOOD LOSS:  Minimum. ? ?COMPLICATIONS:  None. ? ? ?NIK ?D: 10/10/2021 11:50:09 am T: 10/11/2021 2:05:00 am  ?JOB: [id number]  ?

## 2023-01-01 ENCOUNTER — Emergency Department (HOSPITAL_COMMUNITY)
Admission: EM | Admit: 2023-01-01 | Discharge: 2023-01-01 | Disposition: A | Discharge status: Home / Self Care | Payer: Medicaid Other | Attending: Emergency Medicine | Admitting: Emergency Medicine

## 2023-01-01 ENCOUNTER — Other Ambulatory Visit: Payer: Self-pay

## 2023-01-01 DIAGNOSIS — W57XXXA Bitten or stung by nonvenomous insect and other nonvenomous arthropods, initial encounter: Secondary | ICD-10-CM | POA: Insufficient documentation

## 2023-01-01 DIAGNOSIS — S40862A Insect bite (nonvenomous) of left upper arm, initial encounter: Secondary | ICD-10-CM | POA: Insufficient documentation

## 2023-01-01 DIAGNOSIS — Z91040 Latex allergy status: Secondary | ICD-10-CM | POA: Diagnosis not present

## 2023-01-01 MED ORDER — LORATADINE 10 MG PO TABS
10.0000 mg | ORAL_TABLET | Freq: Once | ORAL | Status: AC
  Administered 2023-01-01: 10 mg via ORAL
  Filled 2023-01-01: qty 1

## 2023-01-01 MED ORDER — IBUPROFEN 400 MG PO TABS
600.0000 mg | ORAL_TABLET | Freq: Once | ORAL | Status: AC
  Administered 2023-01-01: 600 mg via ORAL
  Filled 2023-01-01: qty 2

## 2023-01-01 NOTE — ED Triage Notes (Signed)
Pt c/o insect bite to her left arm that occurred around 1430 yesterday. States the pain has gotten worse and that area is swelling more.

## 2023-01-01 NOTE — ED Notes (Signed)
Pt given warm compress. Pt also asked for a ice pack.

## 2023-01-01 NOTE — ED Provider Notes (Signed)
   [place] [facility] AT [facility]  Provider Note  CSN: [id number] Arrival date & time: 01/01/23 0112  History Chief Complaint  Patient presents with   Insect Bite    Carla Schaefer is a 27 y.o. female reports she was bitten by an insect, likely horsefly, on the L upper arm on 07/03 at around noon. She had significant erythema and swelling. Took some liquid benadryl but pain and swelling worsened through the rest of the day. She did not take any other medications before coming to the ED. No other rash or trouble breathing/swallowing.    Home Medications Prior to Admission medications   Medication Sig Start Date End Date Taking? Authorizing Provider  amoxicillin (AMOXIL) 500 MG capsule Take 1 capsule (500 mg total) by mouth 3 (three) times daily. 10/10/21   Ocie Doyne, DMD  ibuprofen (ADVIL) 200 MG tablet Take 600 mg by mouth every 6 (six) hours as needed for mild pain.    [provider]  oxyCODONE-acetaminophen (PERCOCET) 5-325 MG tablet Take 1 tablet by mouth every 4 (four) hours as needed. 10/10/21   Ocie Doyne, DMD     Allergies    Compazine [prochlorperazine edisylate] and Latex   Review of Systems   Review of Systems Please see HPI for pertinent positives and negatives  Physical Exam BP 121/80   Pulse 84   Temp 98 F (36.7 C) (Oral)   Resp 16   SpO2 99%   Physical Exam Vitals and nursing note reviewed.  HENT:     Head: Normocephalic.     Nose: Nose normal.  Eyes:     Extraocular Movements: Extraocular movements intact.  Pulmonary:     Effort: Pulmonary effort is normal.  Musculoskeletal:        General: Normal range of motion.     Cervical back: Neck supple.     Comments: Localized reaction to insect bite on L upper arm, medially  Skin:    Findings: Rash present.  Neurological:     Mental Status: She is alert and oriented to person, place, and time.  Psychiatric:        Mood and Affect: Mood normal.     ED  Results / Procedures / Treatments   EKG None  Procedures Procedures  Medications Ordered in the ED Medications  ibuprofen (ADVIL) tablet 600 mg (has no administration in time range)  loratadine (CLARITIN) tablet 10 mg (has no administration in time range)    Initial Impression and Plan  Patient here with localized reaction to insect bite. No signs of systemic reaction. Recommend antihistamines, Motrin for pain, warm compress. PCP follow up, RTED for any other concerns.    ED Course       MDM Rules/Calculators/A&P Medical Decision Making Problems Addressed: Insect bite of left upper arm, initial encounter: acute illness or injury  Risk OTC drugs. Prescription drug management.     Final Clinical Impression(s) / ED Diagnoses Final diagnoses:  Insect bite of left upper arm, initial encounter    Rx / DC Orders ED Discharge Orders     None        Pollyann Savoy, MD 01/01/23 0345

## 2023-04-10 ENCOUNTER — Other Ambulatory Visit (HOSPITAL_COMMUNITY)
Admission: RE | Admit: 2023-04-10 | Discharge: 2023-04-10 | Disposition: A | Discharge status: Home / Self Care | Payer: Medicaid Other | Source: Ambulatory Visit | Attending: Obstetrics & Gynecology | Admitting: Obstetrics & Gynecology

## 2023-04-10 ENCOUNTER — Encounter: Payer: Self-pay | Admitting: Obstetrics & Gynecology

## 2023-04-10 ENCOUNTER — Ambulatory Visit (INDEPENDENT_AMBULATORY_CARE_PROVIDER_SITE_OTHER): Payer: Medicaid Other | Admitting: Obstetrics & Gynecology

## 2023-04-10 VITALS — BP 122/79 | HR 83 | Ht 60.0 in | Wt 165.8 lb

## 2023-04-10 DIAGNOSIS — Z720 Tobacco use: Secondary | ICD-10-CM

## 2023-04-10 DIAGNOSIS — Z30016 Encounter for initial prescription of transdermal patch hormonal contraceptive device: Secondary | ICD-10-CM | POA: Diagnosis not present

## 2023-04-10 DIAGNOSIS — Z01419 Encounter for gynecological examination (general) (routine) without abnormal findings: Secondary | ICD-10-CM | POA: Diagnosis not present

## 2023-04-10 DIAGNOSIS — Z113 Encounter for screening for infections with a predominantly sexual mode of transmission: Secondary | ICD-10-CM | POA: Diagnosis present

## 2023-04-10 MED ORDER — NORELGESTROMIN-ETH ESTRADIOL 150-35 MCG/24HR TD PTWK: 1.0000 | MEDICATED_PATCH | TRANSDERMAL | 3 refills | Status: DC

## 2023-04-10 NOTE — Progress Notes (Signed)
WELL-WOMAN EXAMINATION Patient name: Carla Schaefer MRN [id number]  Date of birth: 1995/10/08 Chief Complaint:   Gynecologic Exam  History of Present Illness:   Carla Schaefer is a 27 y.o. G60P1011 female being seen today for a routine well-woman exam.   Menses are typically every 3-4 weeks lasting about 3-4 days.  Typically notes considerable pelvic pain and nausea. Also notes diarrhea.  Takes OTC ibuprofen as well as Midol heating patches.  Denies intermenstrual bleeding.  Notes pH balance every time after her period.  She is using several OTC remedies including probiotic and Yoni supplement which has been working for her.  She denies symptoms currently.  []  advised pt to follow up if/when symptoms are present for further evaluation/vaginitis panel  New partner x 3 mos  Patient's last menstrual period was 03/19/2023. Denies issues with her menses The current method of family planning is coitus interruptus and condoms.    Last pap 2018  Last mammogram: na. Last colonoscopy: na     10/26/2017   10:38 AM  Depression screen PHQ 2/9  Decreased Interest 0  Down, Depressed, Hopeless 0  PHQ - 2 Score 0  Altered sleeping 3  Tired, decreased energy 1  Change in appetite 0  Feeling bad or failure about yourself  0  Trouble concentrating 3  Moving slowly or fidgety/restless 3  Suicidal thoughts 0  PHQ-9 Score 10      Review of Systems:   Pertinent items are noted in HPI Denies any headaches, blurred vision, fatigue, shortness of breath, chest pain, abdominal pain, bowel movements, urination, or intercourse unless otherwise stated above.  Pertinent History Reviewed:  Reviewed past medical,surgical, social and family history.  Reviewed problem list, medications and allergies. Physical Assessment:   Vitals:   04/10/23 0827  BP: 122/79  Pulse: 83  Weight: 165 lb 12.8 oz (75.2 kg)  Height: 5' (1.524 m)  Body mass index is 32.38 kg/m.        Physical Examination:    General appearance - well appearing, and in no distress  Mental status - alert, oriented to person, place, and time  Psych:  She has a normal mood and affect  Skin - warm and dry, normal color, no suspicious lesions noted  Chest - effort normal, all lung fields clear to auscultation bilaterally  Heart - normal rate and regular rhythm  Neck:  midline trachea, no thyromegaly or nodules  Breasts - breasts appear normal, no suspicious masses, no skin or nipple changes or  axillary nodes  Abdomen - soft, nontender, nondistended, no masses or organomegaly  Pelvic - VULVA: normal appearing vulva with no masses, tenderness or lesions  VAGINA: normal appearing vagina with normal color and discharge, no lesions  CERVIX: normal appearing cervix without discharge or lesions, no CMT  Thin prep pap is done with HR HPV cotesting  UTERUS: uterus is felt to be normal size, shape, consistency and nontender   ADNEXA: No adnexal masses or tenderness noted.  Extremities:  No swelling or varicosities noted  Chaperone: [name]     Assessment & Plan:  1) Well-Woman Exam -pap up to date, reviewed screening guideliens -declined STI screening  2) Contraceptive management -reviewed contraceptive options -after much discussion agreeable to try patch -f/u in 3-34mos  OCP risk assessment: Pt denies personal history of VTE, stroke or heart attack.  Denies personal h/o breast cancer.  Pt does use tobacco, under the age of 69.   Orders Placed This Encounter  Procedures  RPR   HIV Antibody (routine testing w rflx)    Meds:  Meds ordered this encounter  Medications   norelgestromin-ethinyl estradiol Burr Medico) 150-35 MCG/24HR transdermal patch    Sig: Place 1 patch onto the skin once a week.    Dispense:  9 patch    Refill:  3    Follow-up: Return in about 4 months (around 08/11/2023) for Medication follow up.   [name], DO Attending Obstetrician & Gynecologist, [facility]  for [employer], [facility]

## 2023-04-11 LAB — RPR: RPR Ser Ql: NONREACTIVE

## 2023-04-11 LAB — HIV ANTIBODY (ROUTINE TESTING W REFLEX): HIV Screen 4th Generation wRfx: NONREACTIVE

## 2023-04-14 LAB — CYTOLOGY - PAP
Chlamydia: NEGATIVE
Comment: NEGATIVE
Comment: NEGATIVE
Comment: NORMAL
Diagnosis: NEGATIVE
High risk HPV: NEGATIVE
Neisseria Gonorrhea: NEGATIVE

## 2023-05-25 ENCOUNTER — Encounter: Payer: Self-pay | Admitting: Adult Health

## 2023-05-25 ENCOUNTER — Ambulatory Visit: Payer: Medicaid Other | Admitting: Adult Health

## 2023-05-25 VITALS — BP 124/80 | HR 88 | Ht 60.0 in | Wt 165.0 lb

## 2023-05-25 DIAGNOSIS — R1032 Left lower quadrant pain: Secondary | ICD-10-CM

## 2023-05-25 NOTE — Progress Notes (Signed)
  Subjective:     Patient ID: Carla Schaefer, female   DOB: 07-30-1995, 27 y.o.   MRN: [id number]  HPI Carla Schaefer is a 27 year old white female,single, G2P1011, in complaining of pain LLQ on and off for 2-3 weeks, since starting the patch. Has had ovarian cyst in the past.      Component Value Date/Time   DIAGPAP  04/10/2023 0849    - Negative for intraepithelial lesion or malignancy (NILM)   DIAGPAP  10/07/2016 0000    NEGATIVE FOR INTRAEPITHELIAL LESIONS OR MALIGNANCY.   HPVHIGH Negative 04/10/2023 0849   ADEQPAP  04/10/2023 0849    Satisfactory for evaluation; transformation zone component PRESENT.   ADEQPAP  10/07/2016 0000    Satisfactory for evaluation  endocervical/transformation zone component PRESENT.   Review of Systems LLQ pain on and off for 2-3 weeks No new sex partners Had odor after periods at times Reviewed past medical,surgical, social and family history. Reviewed medications and allergies.     Objective:   Physical Exam BP 124/80 (BP Location: Left Arm, Patient Position: Sitting, Cuff Size: Normal)   Pulse 88   Ht 5' (1.524 m)   Wt 165 lb (74.8 kg)   LMP 05/17/2023   BMI 32.22 kg/m     Skin warm and dry.Pelvic: external genitalia is normal in appearance no lesions, vagina: white discharge without odor,urethra has no lesions or masses noted, cervix:smooth and bulbous, no CMT,uterus: normal size, shape and contour, non tender, no masses felt, adnexa: no masses, LLQ tenderness noted. Bladder is non tender and no masses felt.  Upstream - 05/25/23 0902       Pregnancy Intention Screening   Does the patient want to become pregnant in the next year? No    Does the patient's partner want to become pregnant in the next year? No    Would the patient like to discuss contraceptive options today? No      Contraception Wrap Up   Current Method Contraceptive Patch    End Method Contraceptive Patch    Contraception Counseling Provided No            Examination  chaperoned by Maury Dus RN  Assessment:     1. LLQ pain +LLQ tenderness today Has pain on and off for 2-3 weeks, since starting the patch Will get pelvic US in 2 weeks in office, at her request - US PELVIC COMPLETE WITH TRANSVAGINAL; Future    Take 2 ES tylenol and 3 200 mg ibuprofen every 6-8 hours prn pain Increase fluids  Plan:     Return for [place] in about 2 weeks to assess uterus and ovaries

## 2023-06-09 ENCOUNTER — Ambulatory Visit (INDEPENDENT_AMBULATORY_CARE_PROVIDER_SITE_OTHER): Payer: Medicaid Other | Admitting: Radiology

## 2023-06-09 DIAGNOSIS — R1032 Left lower quadrant pain: Secondary | ICD-10-CM | POA: Diagnosis not present

## 2023-06-09 NOTE — Progress Notes (Signed)
TA and TV imaging performed - vinyl probe cover used - Chaperone: Quila LMP 05-17-23 Anteverted uterus normal in size, symmetrical myometrial walls, no focal mass seen EEC = 5.1 mm  avascular cavity and canal, homogeneous endom,  no evidence of intracavitary soft tissue defects Normal Rt ov  -   both ovaries are mobile Left ovary seen with small collasped echofree cyst, likely physiologic process.  Cyst = 14 x 4 mm Neg adnexal regions - no mass seen - Neg CDS - no free fluid present

## 2024-03-28 ENCOUNTER — Other Ambulatory Visit: Payer: Self-pay | Admitting: *Deleted

## 2024-03-28 DIAGNOSIS — Z30016 Encounter for initial prescription of transdermal patch hormonal contraceptive device: Secondary | ICD-10-CM

## 2024-03-30 ENCOUNTER — Other Ambulatory Visit: Payer: Self-pay

## 2024-03-31 ENCOUNTER — Telehealth: Payer: Self-pay | Admitting: Obstetrics & Gynecology

## 2024-03-31 ENCOUNTER — Other Ambulatory Visit: Payer: Self-pay | Admitting: Obstetrics & Gynecology

## 2024-03-31 DIAGNOSIS — Z30016 Encounter for initial prescription of transdermal patch hormonal contraceptive device: Secondary | ICD-10-CM

## 2024-03-31 MED ORDER — NORELGESTROMIN-ETH ESTRADIOL 150-35 MCG/24HR TD PTWK
1.0000 | MEDICATED_PATCH | TRANSDERMAL | 3 refills | Status: AC
Start: 2024-03-31 — End: 2024-06-29

## 2024-03-31 NOTE — Telephone Encounter (Signed)
 Pt is requesting Xulane to be refilled.

## 2024-03-31 NOTE — Telephone Encounter (Signed)
Refill request sent to Dr. Ozan.  

## 2024-03-31 NOTE — Progress Notes (Signed)
 Refill xulane  Marsha Hillman, DO Attending Obstetrician & Gynecologist, [facility] for [employer], [facility]

## 2024-06-02 MED ORDER — NORELGESTROMIN-ETH ESTRADIOL 150-35 MCG/24HR TD PTWK: 1.0000 | MEDICATED_PATCH | TRANSDERMAL | 1 refills | Status: AC
